# Patient Record
Sex: Female | Born: 2000 | Race: White | Hispanic: No | Marital: Single | Smoking: Never smoker
Health system: Southern US, Community
[De-identification: ages and names within clinical notes are randomized; demographics above are authoritative.]

## PROBLEM LIST (undated history)

## (undated) DIAGNOSIS — E282 Polycystic ovarian syndrome: Secondary | ICD-10-CM

## (undated) DIAGNOSIS — J069 Acute upper respiratory infection, unspecified: Secondary | ICD-10-CM

## (undated) DIAGNOSIS — M25562 Pain in left knee: Secondary | ICD-10-CM

## (undated) DIAGNOSIS — M25362 Other instability, left knee: Secondary | ICD-10-CM

## (undated) DIAGNOSIS — N390 Urinary tract infection, site not specified: Secondary | ICD-10-CM

## (undated) DIAGNOSIS — D649 Anemia, unspecified: Secondary | ICD-10-CM

## (undated) DIAGNOSIS — O1490 Unspecified pre-eclampsia, unspecified trimester: Secondary | ICD-10-CM

## (undated) HISTORY — DX: Acute upper respiratory infection, unspecified: J06.9

## (undated) HISTORY — PX: OTHER SURGICAL HISTORY: SHX169

## (undated) HISTORY — DX: Other instability, left knee: M25.362

## (undated) HISTORY — DX: Other instability, left knee: M25.562

## (undated) HISTORY — PX: TONSILLECTOMY: SUR1361

---
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
## 2022-03-08 ENCOUNTER — Encounter: Payer: Self-pay | Admitting: Women's Health

## 2022-03-12 DIAGNOSIS — Z3A01 Less than 8 weeks gestation of pregnancy: Secondary | ICD-10-CM | POA: Diagnosis not present

## 2022-03-12 DIAGNOSIS — O209 Hemorrhage in early pregnancy, unspecified: Secondary | ICD-10-CM | POA: Diagnosis not present

## 2022-03-12 DIAGNOSIS — N760 Acute vaginitis: Secondary | ICD-10-CM | POA: Diagnosis not present

## 2022-03-25 ENCOUNTER — Other Ambulatory Visit: Payer: Self-pay | Admitting: Obstetrics & Gynecology

## 2022-03-25 DIAGNOSIS — O3680X0 Pregnancy with inconclusive fetal viability, not applicable or unspecified: Secondary | ICD-10-CM

## 2022-03-28 ENCOUNTER — Encounter: Payer: Self-pay | Admitting: *Deleted

## 2022-03-28 ENCOUNTER — Ambulatory Visit (INDEPENDENT_AMBULATORY_CARE_PROVIDER_SITE_OTHER): Payer: Medicaid Other

## 2022-03-28 ENCOUNTER — Ambulatory Visit: Payer: Medicaid Other | Admitting: *Deleted

## 2022-03-28 VITALS — BP 120/87 | HR 83 | Wt 164.0 lb

## 2022-03-28 DIAGNOSIS — Z3400 Encounter for supervision of normal first pregnancy, unspecified trimester: Secondary | ICD-10-CM | POA: Insufficient documentation

## 2022-03-28 DIAGNOSIS — O3680X0 Pregnancy with inconclusive fetal viability, not applicable or unspecified: Secondary | ICD-10-CM

## 2022-03-28 DIAGNOSIS — Z3A08 8 weeks gestation of pregnancy: Secondary | ICD-10-CM | POA: Diagnosis not present

## 2022-03-28 DIAGNOSIS — Z3401 Encounter for supervision of normal first pregnancy, first trimester: Secondary | ICD-10-CM

## 2022-03-28 MED ORDER — BLOOD PRESSURE MONITOR MISC: 0 refills | Status: DC

## 2022-03-28 NOTE — Progress Notes (Signed)
[place] 8+3 wks,CRL 17.55 mm,normal ovaries,FHR 171 bpm

## 2022-03-28 NOTE — Progress Notes (Cosign Needed)
   INITIAL OB NURSE INTAKE  SUBJECTIVE:  Paula Roberts is a 21 y.o. G23P0 female 43w3dby LMP c/w today's U/S with an Estimated Date of Delivery: 11/04/22 being seen today for her initial OB intake/educational visit with RN. She is taking prenatal vitamins.  She is having nausea and/or vomiting and does request nausea meds at this time.  Patient's last menstrual period was 01/28/2022.  Patient's medical, surgical, and obstetrical history obtained and reviewed.  Current medications reviewed.   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   Hirsutism 07/24/2017   Dysmenorrhea in adolescent 08/01/2016   Poor sleep hygiene 12/25/2015   Esophageal reflux 12/25/2015   Keratosis pilaris 12/25/2015   Acne 12/16/2014   Obesity due to excess calories without serious comorbidity with body mass index (BMI) in 95th to 98th percentile for age in pediatric patient 12/16/2014   Hyponatremia 08/12/2012   Pyelonephritis, acute 08/12/2012   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    PCO (polycystic ovaries)    Recurrent upper respiratory infection (URI)    Urinary tract infection    Past Surgical History:  Procedure Laterality Date   BREAST REDUCTION SURGERY Bilateral 04/24/2020   Procedure: MAMMARY REDUCTION  (BREAST);  Surgeon: TIrene Limbo MD;  Location[facility]  Service: Plastics;  Laterality: Bilateral;   KNEE ARTHROSCOPY WITH MEDIAL PATELLAR FEMORAL LIGAMENT RECONSTRUCTION Left 09/25/2019   Procedure: KNEE ARTHROSCOPY WITH MEDIAL PATELLAR FEMORAL LIGAMENT RECONSTRUCTION EXTRA ARTICULAR;  Surgeon: VHiram Gash MD;  Location[facility]  Service: Orthopedics;  Laterality: Left;   none     TONSILLECTOMY     OB  History     Gravida  1   Para      Term      Preterm      AB      Living         SAB      IAB      Ectopic      Multiple      Live Births              OBJECTIVE:  LMP 01/28/2022   ASSESSMENT/PLAN: G1P0 at 874w3dith an Estimated Date of Delivery: 11/04/22  Prenatal vitamins: continue   Nausea medicines: REQUESTS RX   OB packet given: Yes BabyScripts and MyChart activated  Blood Pressure Cuff: Rx sent today. Discussed to be used for virtual visits and home BP checks.  Genetic & carrier screening discussed: requests Panorama, NT/IT, and Horizon  Placed OB Box on problem list and updated Reviewed recommended weight gain based on pre-gravid BMI BMI >=30, gain max 11-20lb  Follow-up in 4 weeks for NT U/S & New OB visit with provider  Face-to-face time at least 30 minutes. 50% or more of this visit was spent in counseling and coordination of care.  LaKristeen Missresenzo RN-C 03/28/2022 4:53 PM  Chart and initial new ob nurse intake visit note reviewed and agree with plan of care. Rx bonjesta.  KiHinckleyWHHarlingen Surgical [facility] LLC1/21/2023 12:31 PM

## 2022-03-29 MED ORDER — BONJESTA 20-20 MG PO TBCR: 1.0000 | EXTENDED_RELEASE_TABLET | Freq: Every day | ORAL | 8 refills | Status: DC

## 2022-03-29 NOTE — Addendum Note (Signed)
Addended by: Roma Schanz on: 03/29/2022 12:31 PM   Modules accepted: Orders

## 2022-04-04 ENCOUNTER — Telehealth: Payer: Self-pay | Admitting: Women's Health

## 2022-04-04 NOTE — Telephone Encounter (Signed)
Called patient back, no answer. Unable to leave message because mailbox is full. We need the prior authorization from the pharmacy in order to submit it. Checked the fax box and it is not in there.

## 2022-04-04 NOTE — Telephone Encounter (Signed)
Patient called stating that [employer] told her that they were waiting on [place] to complete the prior autho for the patients nausea medicine. So the patient called her insurance and the insurance called stating that they need prior autho for [employer]

## 2022-04-22 ENCOUNTER — Other Ambulatory Visit: Payer: Self-pay | Admitting: Obstetrics & Gynecology

## 2022-04-22 DIAGNOSIS — Z3682 Encounter for antenatal screening for nuchal translucency: Secondary | ICD-10-CM

## 2022-04-25 ENCOUNTER — Encounter: Payer: Self-pay | Admitting: Women's Health

## 2022-04-25 ENCOUNTER — Encounter: Payer: Self-pay | Admitting: *Deleted

## 2022-04-25 ENCOUNTER — Ambulatory Visit (INDEPENDENT_AMBULATORY_CARE_PROVIDER_SITE_OTHER): Payer: Medicaid Other

## 2022-04-25 ENCOUNTER — Ambulatory Visit (INDEPENDENT_AMBULATORY_CARE_PROVIDER_SITE_OTHER): Payer: Self-pay | Admitting: Women's Health

## 2022-04-25 VITALS — BP 123/85 | HR 89 | Wt 160.4 lb

## 2022-04-25 DIAGNOSIS — Z131 Encounter for screening for diabetes mellitus: Secondary | ICD-10-CM | POA: Diagnosis not present

## 2022-04-25 DIAGNOSIS — Z3401 Encounter for supervision of normal first pregnancy, first trimester: Secondary | ICD-10-CM

## 2022-04-25 DIAGNOSIS — Z3402 Encounter for supervision of normal first pregnancy, second trimester: Secondary | ICD-10-CM

## 2022-04-25 DIAGNOSIS — Z3682 Encounter for antenatal screening for nuchal translucency: Secondary | ICD-10-CM | POA: Diagnosis not present

## 2022-04-25 DIAGNOSIS — Z3A12 12 weeks gestation of pregnancy: Secondary | ICD-10-CM

## 2022-04-25 MED ORDER — ASPIRIN 81 MG PO TBEC: 81.0000 mg | DELAYED_RELEASE_TABLET | Freq: Every day | ORAL | 3 refills | Status: DC

## 2022-04-25 NOTE — Patient Instructions (Signed)
Paula Roberts, thank you for choosing our office today! We appreciate the opportunity to meet your healthcare needs. You may receive a short survey by mail, e-mail, or through [employer]. If you are happy with your care we would appreciate if you could take just a few minutes to complete the survey questions. We read all of your comments and take your feedback very seriously. Thank you again for choosing our office.  [facility] [employer] [facility] Team at [place] at [facility] ([place], [place] [address]) Entrance C, located off of [address] parking   Nausea & Vomiting Have saltine crackers or pretzels by your bed and eat a few bites before you raise your head out of bed in the morning Eat small frequent meals throughout the day instead of large meals Drink plenty of fluids throughout the day to stay hydrated, just don't drink a lot of fluids with your meals.  This can make your stomach fill up faster making you feel sick Do not brush your teeth right after you eat Products with real ginger are good for nausea, like ginger ale and ginger hard candy Make sure it says made with real ginger! Sucking on sour candy like lemon heads is also good for nausea If your prenatal vitamins make you nauseated, take them at night so you will sleep through the nausea Sea Bands If you feel like you need medicine for the nausea & vomiting please let us know If you are unable to keep any fluids or food down please let us know   Constipation Drink plenty of fluid, preferably water, throughout the day Eat foods high in fiber such as fruits, vegetables, and grains Exercise, such as walking, is a good way to keep your bowels regular Drink warm fluids, especially warm prune juice, or decaf coffee Eat a 1/2 cup of real oatmeal (not instant), 1/2 cup applesauce, and 1/2-1 cup warm prune juice every day If needed, you may take Colace (docusate sodium) stool softener  once or twice a day to help keep the stool soft.  If you still are having problems with constipation, you may take Miralax once daily as needed to help keep your bowels regular.   Home Blood Pressure Monitoring for Patients   Your provider has recommended that you check your blood pressure (BP) at least once a week at home. If you do not have a blood pressure cuff at home, one will be provided for you. Contact your provider if you have not received your monitor within 1 week.   Helpful Tips for Accurate Home Blood Pressure Checks  Don't smoke, exercise, or drink caffeine 30 minutes before checking your BP Use the restroom before checking your BP (a full bladder can raise your pressure) Relax in a comfortable upright chair Feet on the ground Left arm resting comfortably on a flat surface at the level of your heart Legs uncrossed Back supported Sit quietly and don't talk Place the cuff on your bare arm Adjust snuggly, so that only two fingertips can fit between your skin and the top of the cuff Check 2 readings separated by at least one minute Keep a log of your BP readings For a visual, please reference this diagram: http://ccnc.care/bpdiagram  Provider Name: [facility]     Phone: [phone]  Zone 1: ALL CLEAR  Continue to monitor your symptoms:  BP reading is less than 140 (top number) or less than 90 (bottom  number)  No right upper stomach pain No headaches or seeing spots No feeling nauseated or throwing up No swelling in face and hands  Zone 2: CAUTION Call your doctor's office for any of the following:  BP reading is greater than 140 (top number) or greater than 90 (bottom number)  Stomach pain under your ribs in the middle or right side Headaches or seeing spots Feeling nauseated or throwing up Swelling in face and hands  Zone 3: EMERGENCY  Seek immediate medical care if you have any of the following:  BP reading is greater than160 (top number) or greater than  110 (bottom number) Severe headaches not improving with Tylenol Serious difficulty catching your breath Any worsening symptoms from Zone 2    First Trimester of Pregnancy The first trimester of pregnancy is from week 1 until the end of week 12 (months 1 through 3). A week after a sperm fertilizes an egg, the egg will implant on the wall of the uterus. This embryo will begin to develop into a baby. Genes from you and your partner are forming the baby. The female genes determine whether the baby is a boy or a girl. At 6-8 weeks, the eyes and face are formed, and the heartbeat can be seen on ultrasound. At the end of 12 weeks, all the baby's organs are formed.  Now that you are pregnant, you will want to do everything you can to have a healthy baby. Two of the most important things are to get good prenatal care and to follow your health care provider's instructions. Prenatal care is all the medical care you receive before the baby's birth. This care will help prevent, find, and treat any problems during the pregnancy and childbirth. BODY CHANGES Your body goes through many changes during pregnancy. The changes vary from woman to woman.  You may gain or lose a couple of pounds at first. You may feel sick to your stomach (nauseous) and throw up (vomit). If the vomiting is uncontrollable, call your health care provider. You may tire easily. You may develop headaches that can be relieved by medicines approved by your health care provider. You may urinate more often. Painful urination may mean you have a bladder infection. You may develop heartburn as a result of your pregnancy. You may develop constipation because certain hormones are causing the muscles that push waste through your intestines to slow down. You may develop hemorrhoids or swollen, bulging veins (varicose veins). Your breasts may begin to grow larger and become tender. Your nipples may stick out more, and the tissue that surrounds them  (areola) may become darker. Your gums may bleed and may be sensitive to brushing and flossing. Dark spots or blotches (chloasma, mask of pregnancy) may develop on your face. This will likely fade after the baby is born. Your menstrual periods will stop. You may have a loss of appetite. You may develop cravings for certain kinds of food. You may have changes in your emotions from day to day, such as being excited to be pregnant or being concerned that something may go wrong with the pregnancy and baby. You may have more vivid and strange dreams. You may have changes in your hair. These can include thickening of your hair, rapid growth, and changes in texture. Some women also have hair loss during or after pregnancy, or hair that feels dry or thin. Your hair will most likely return to normal after your baby is born. WHAT TO EXPECT AT YOUR PRENATAL  VISITS During a routine prenatal visit: You will be weighed to make sure you and the baby are growing normally. Your blood pressure will be taken. Your abdomen will be measured to track your baby's growth. The fetal heartbeat will be listened to starting around week 10 or 12 of your pregnancy. Test results from any previous visits will be discussed. Your health care provider may ask you: How you are feeling. If you are feeling the baby move. If you have had any abnormal symptoms, such as leaking fluid, bleeding, severe headaches, or abdominal cramping. If you have any questions. Other tests that may be performed during your first trimester include: Blood tests to find your blood type and to check for the presence of any previous infections. They will also be used to check for low iron levels (anemia) and Rh antibodies. Later in the pregnancy, blood tests for diabetes will be done along with other tests if problems develop. Urine tests to check for infections, diabetes, or protein in the urine. An ultrasound to confirm the proper growth and development  of the baby. An amniocentesis to check for possible genetic problems. Fetal screens for spina bifida and Down syndrome. You may need other tests to make sure you and the baby are doing well. HOME CARE INSTRUCTIONS  Medicines Follow your health care provider's instructions regarding medicine use. Specific medicines may be either safe or unsafe to take during pregnancy. Take your prenatal vitamins as directed. If you develop constipation, try taking a stool softener if your health care provider approves. Diet Eat regular, well-balanced meals. Choose a variety of foods, such as meat or vegetable-based protein, fish, milk and low-fat dairy products, vegetables, fruits, and whole grain breads and cereals. Your health care provider will help you determine the amount of weight gain that is right for you. Avoid raw meat and uncooked cheese. These carry germs that can cause birth defects in the baby. Eating four or five small meals rather than three large meals a day may help relieve nausea and vomiting. If you start to feel nauseous, eating a few soda crackers can be helpful. Drinking liquids between meals instead of during meals also seems to help nausea and vomiting. If you develop constipation, eat more high-fiber foods, such as fresh vegetables or fruit and whole grains. Drink enough fluids to keep your urine clear or pale yellow. Activity and Exercise Exercise only as directed by your health care provider. Exercising will help you: Control your weight. Stay in shape. Be prepared for labor and delivery. Experiencing pain or cramping in the lower abdomen or low back is a good sign that you should stop exercising. Check with your health care provider before continuing normal exercises. Try to avoid standing for long periods of time. Move your legs often if you must stand in one place for a long time. Avoid heavy lifting. Wear low-heeled shoes, and practice good posture. You may continue to have sex  unless your health care provider directs you otherwise. Relief of Pain or Discomfort Wear a good support bra for breast tenderness.   Take warm sitz baths to soothe any pain or discomfort caused by hemorrhoids. Use hemorrhoid cream if your health care provider approves.   Rest with your legs elevated if you have leg cramps or low back pain. If you develop varicose veins in your legs, wear support hose. Elevate your feet for 15 minutes, 3-4 times a day. Limit salt in your diet. Prenatal Care Schedule your prenatal visits by the  twelfth week of pregnancy. They are usually scheduled monthly at first, then more often in the last 2 months before delivery. Write down your questions. Take them to your prenatal visits. Keep all your prenatal visits as directed by your health care provider. Safety Wear your seat belt at all times when driving. Make a list of emergency phone numbers, including numbers for family, friends, the hospital, and police and fire departments. General Tips Ask your health care provider for a referral to a local prenatal education class. Begin classes no later than at the beginning of month 6 of your pregnancy. Ask for help if you have counseling or nutritional needs during pregnancy. Your health care provider can offer advice or refer you to specialists for help with various needs. Do not use hot tubs, steam rooms, or saunas. Do not douche or use tampons or scented sanitary pads. Do not cross your legs for long periods of time. Avoid cat litter boxes and soil used by cats. These carry germs that can cause birth defects in the baby and possibly loss of the fetus by miscarriage or stillbirth. Avoid all smoking, herbs, alcohol, and medicines not prescribed by your health care provider. Chemicals in these affect the formation and growth of the baby. Schedule a dentist appointment. At home, brush your teeth with a soft toothbrush and be gentle when you floss. SEEK MEDICAL CARE IF:   You have dizziness. You have mild pelvic cramps, pelvic pressure, or nagging pain in the abdominal area. You have persistent nausea, vomiting, or diarrhea. You have a bad smelling vaginal discharge. You have pain with urination. You notice increased swelling in your face, hands, legs, or ankles. SEEK IMMEDIATE MEDICAL CARE IF:  You have a fever. You are leaking fluid from your vagina. You have spotting or bleeding from your vagina. You have severe abdominal cramping or pain. You have rapid weight gain or loss. You vomit blood or material that looks like coffee grounds. You are exposed to [place] measles and have never had them. You are exposed to fifth disease or chickenpox. You develop a severe headache. You have shortness of breath. You have any kind of trauma, such as from a fall or a car accident. Document Released: 04/19/2001 Document Revised: 09/09/2013 Document Reviewed: 03/05/2013 [facility] Patient Information 2015 [place], [place]. This information is not intended to replace advice given to you by your health care provider. Make sure you discuss any questions you have with your health care provider.

## 2022-04-25 NOTE — Progress Notes (Signed)
INITIAL OBSTETRICAL VISIT Patient name: Paula Roberts MRN [id number]  Date of birth: Jul 22, 2000 Chief Complaint:   Routine Prenatal Visit (New OB)  History of Present Illness:   Paula Roberts is a 21 y.o. G64P0 Caucasian female at 14w3dby LMP c/w u/s at 8 weeks with an Estimated Date of Delivery: 11/04/22 being seen today for her initial obstetrical visit.   Patient's last menstrual period was 01/28/2022. Her obstetrical history is significant for primigravida.   Today she reports  nausea improving- was issue w/ PA and never got meds that were rx'd, declines now .  Pt's mom has h/o IUFD x2 at 313& 37wks, states one was an issue with diabetes. Had 'acid issues' and IOL w/ a subsequent pregnancy, unsure if bile acids/ICP. Does not speak to her mom.  Last pap <21yo. Results were: N/A     04/25/2022    1:52 PM 10/15/2018   11:10 AM  Depression screen PHQ 2/9  Decreased Interest 0 0  Down, Depressed, Hopeless 0 0  PHQ - 2 Score 0 0  Altered sleeping 0 1  Tired, decreased energy 1 1  Change in appetite 1 0  Feeling bad or failure about yourself  0 0  Trouble concentrating 0 0  Moving slowly or fidgety/restless 0 0  Suicidal thoughts 0   PHQ-9 Score 2 2        04/25/2022    1:52 PM  GAD 7 : Generalized Anxiety Score  Nervous, Anxious, on Edge 0  Control/stop worrying 0  Worry too much - different things 0  Trouble relaxing 0  Restless 0  Easily annoyed or irritable 1  Afraid - awful might happen 0  Total GAD 7 Score 1     Review of Systems:   Pertinent items are noted in HPI Denies cramping/contractions, leakage of fluid, vaginal bleeding, abnormal vaginal discharge w/ itching/odor/irritation, headaches, visual changes, shortness of breath, chest pain, abdominal pain, severe nausea/vomiting, or problems with urination or bowel movements unless otherwise stated above.  Pertinent History Reviewed:  Reviewed past medical,surgical, social, obstetrical and family history.   Reviewed problem list, medications and allergies. OB History  Gravida Para Term Preterm AB Living  1            SAB IAB Ectopic Multiple Live Births               # Outcome Date GA Lbr Len/2nd Weight Sex Delivery Anes PTL Lv  1 Current            Physical Assessment:   Vitals:   04/25/22 1411  BP: 123/85  Pulse: 89  Weight: 160 lb 6 oz (72.7 kg)  Body mass index is 31.08 kg/m.       Physical Examination:  General appearance - well appearing, and in no distress  Mental status - alert, oriented to person, place, and time  Psych:  She has a normal mood and affect  Skin - warm and dry, normal color, no suspicious lesions noted  Chest - effort normal, all lung fields clear to auscultation bilaterally  Heart - normal rate and regular rhythm  Abdomen - soft, nontender  Extremities:  No swelling or varicosities noted  Thin prep pap is not done   Chaperone: N/A    TODAY'S NT [place]12+3 wks,measurements c/w dates,CRL 57.81 mm,normal ovaries,NB present,NT 1.6 mm,FHR 164 bpm   No results found for this or any previous visit (from the past 24 hour(s)).  Assessment &  Plan:  1) Low-Risk Pregnancy G1P0 at 72w3dwith an Estimated Date of Delivery: 11/04/22   2) Initial OB visit  3) Family h/o IUFD> mom had 2 IUFDs (34 & 37wks), one d/t 'diabetes issue'  Meds:  Meds ordered this encounter  Medications   aspirin EC 81 MG tablet    Sig: Take 1 tablet (81 mg total) by mouth daily. Swallow whole.    Dispense:  90 tablet    Refill:  3    Initial labs obtained Continue prenatal vitamins Reviewed n/v relief measures and warning s/s to report Reviewed recommended weight gain based on pre-gravid BMI Encouraged well-balanced diet Genetic & carrier screening discussed: requests Panorama, NT/IT, and Horizon  Ultrasound discussed; fetal survey: requested CCNC completed> form faxed if has or is planning to apply for medicaid The nature of [employer] [facility]  multiple MDs and other Advanced Practice Providers was explained to patient; also emphasized that fellows, residents, and students are part of our team. Does have home bp cuff. Rx'd at new ob intake w/ nurse. Check bp weekly, let uKoreaknow if consistently >140/90.   Indications for ASA therapy (per uptodate) OR Two or more of the following: Nulliparity Yes Obesity (BMI>30 kg/m2) Yes  Indications for early A1C (per uptodate) BMI >=25 (>=23 in Asian women) AND one of the following First-degree relative with diabetes Yes (pt's mom) PCOS Yes  Follow-up: Return for 4wks 2nd IT & LROB, 7wks from now anatomy uCathay   Orders Placed This Encounter  Procedures   GC/Chlamydia Probe Amp   Urine Culture   Panorama Prenatal Test Full Panel   HORIZON CUSTOM   Integrated 1   CBC/D/Plt+RPR+Rh+ABO+RubIgG...   Hemoglobin A1c    [place]12/18/2023 2:41 PM

## 2022-04-25 NOTE — Progress Notes (Signed)
[place] 12+3 wks,measurements c/w dates,CRL 57.81 mm,normal ovaries,NB present,NT 1.6 mm,FHR 164 bpm

## 2022-04-27 LAB — INTEGRATED 1
Crown Rump Length: 57.8 mm
Gest. Age on Collection Date: 12.1 weeks
Maternal Age at EDD: 20.3 yr
Nuchal Translucency (NT): 1.6 mm
Number of Fetuses: 1
PAPP-A Value: 1906 ng/mL
Weight: 160 [lb_av]

## 2022-04-27 LAB — CBC/D/PLT+RPR+RH+ABO+RUBIGG...
Antibody Screen: NEGATIVE
Basophils Absolute: 0 10*3/uL (ref 0.0–0.2)
Basos: 0 %
EOS (ABSOLUTE): 0.1 10*3/uL (ref 0.0–0.4)
Eos: 0 %
HCV Ab: NONREACTIVE
HIV Screen 4th Generation wRfx: NONREACTIVE
Hematocrit: 41.5 % (ref 34.0–46.6)
Hemoglobin: 14.2 g/dL (ref 11.1–15.9)
Hepatitis B Surface Ag: NEGATIVE
Immature Grans (Abs): 0 10*3/uL (ref 0.0–0.1)
Immature Granulocytes: 0 %
Lymphocytes Absolute: 2.3 10*3/uL (ref 0.7–3.1)
Lymphs: 20 %
MCH: 32.5 pg (ref 26.6–33.0)
MCHC: 34.2 g/dL (ref 31.5–35.7)
MCV: 95 fL (ref 79–97)
Monocytes Absolute: 0.5 10*3/uL (ref 0.1–0.9)
Monocytes: 5 %
Neutrophils Absolute: 8.5 10*3/uL — ABNORMAL HIGH (ref 1.4–7.0)
Neutrophils: 75 %
Platelets: 324 10*3/uL (ref 150–450)
RBC: 4.37 x10E6/uL (ref 3.77–5.28)
RDW: 12.3 % (ref 11.7–15.4)
RPR Ser Ql: NONREACTIVE
Rh Factor: POSITIVE
Rubella Antibodies, IGG: 1.58 index (ref >0.99)
WBC: 11.4 10*3/uL — ABNORMAL HIGH (ref 3.4–10.8)

## 2022-04-27 LAB — GC/CHLAMYDIA PROBE AMP
Chlamydia trachomatis, NAA: NEGATIVE
Neisseria Gonorrhoeae by PCR: NEGATIVE

## 2022-04-27 LAB — HEMOGLOBIN A1C
Est. average glucose Bld gHb Est-mCnc: 100 mg/dL
Hgb A1c MFr Bld: 5.1 % (ref 4.8–5.6)

## 2022-04-27 LAB — URINE CULTURE

## 2022-04-27 LAB — HCV INTERPRETATION

## 2022-05-09 LAB — PANORAMA PRENATAL TEST FULL PANEL:PANORAMA TEST PLUS 5 ADDITIONAL MICRODELETIONS

## 2022-05-09 LAB — HORIZON CUSTOM: REPORT SUMMARY: NEGATIVE

## 2022-05-09 NOTE — L&D Delivery Note (Signed)
Delivery Note 22 y.o. G1P0 at [redacted]w[redacted]d admitted for IOL due to gHTN and noted to have pre-eclampsia with severe features, on Magnesium.   At 9:42 AM a viable female was delivered via Vaginal, Spontaneous (Presentation: Left Occiput Anterior).  APGAR: 8, 9; weight 5 lb 12.1 oz (2610 g).    Attempted controlled cord traction for 3rd stage of labor, however, the cord avulsed twice, necessitating a manual placenta extraction which was performed easily and with placenta intact.  Noted to have a continuous small trickle of blood. TXA given, fundal massage, and of cytotec rectally. Also had lower uterine segment sweep X 2. Bleeding slowed down significantly.  Placenta status: Manual removal, Intact.  Cord: 3 vessels with the following complications: Short.  Cord pH: not collected  Anesthesia: Epidural Episiotomy: None Lacerations: None Suture Repair:  None Est. Blood Loss (mL): 722  IV ancef 2g given for antibiotic prophylaxis.  Mom to postpartum.  Baby to Couplet care / Skin to Skin.   Sheppard Evens MD MPH OB Fellow, Faculty Practice [facility], [facility] for [facility] 10/15/2022

## 2022-05-15 ENCOUNTER — Encounter: Payer: Self-pay | Admitting: Women's Health

## 2022-05-16 LAB — PANORAMA PRENATAL TEST FULL PANEL:PANORAMA TEST PLUS 5 ADDITIONAL MICRODELETIONS: FETAL FRACTION: 7.9

## 2022-05-23 ENCOUNTER — Ambulatory Visit (INDEPENDENT_AMBULATORY_CARE_PROVIDER_SITE_OTHER): Payer: Medicaid Other | Admitting: Women's Health

## 2022-05-23 ENCOUNTER — Encounter: Payer: Self-pay | Admitting: Women's Health

## 2022-05-23 VITALS — BP 109/76 | HR 77 | Wt 168.5 lb

## 2022-05-23 DIAGNOSIS — Z363 Encounter for antenatal screening for malformations: Secondary | ICD-10-CM

## 2022-05-23 DIAGNOSIS — Z3402 Encounter for supervision of normal first pregnancy, second trimester: Secondary | ICD-10-CM | POA: Diagnosis not present

## 2022-05-23 DIAGNOSIS — Z1379 Encounter for other screening for genetic and chromosomal anomalies: Secondary | ICD-10-CM | POA: Diagnosis not present

## 2022-05-23 NOTE — Progress Notes (Signed)
LOW-RISK PREGNANCY VISIT Patient name: Paula Roberts MRN [id number]  Date of birth: 04-29-2001 Chief Complaint:   Routine Prenatal Visit (2nd IT today)  History of Present Illness:   ABBEGAIL MATUSKA is a 22 y.o. G1P0 female at 94w3dwith an Estimated Date of Delivery: 11/04/22 being seen today for ongoing management of a low-risk pregnancy.   Today she reports no complaints. Contractions: Not present. Vag. Bleeding: None.  Movement: Present. denies leaking of fluid.     04/25/2022    1:52 PM 10/15/2018   11:10 AM  Depression screen PHQ 2/9  Decreased Interest 0 0  Down, Depressed, Hopeless 0 0  PHQ - 2 Score 0 0  Altered sleeping 0 1  Tired, decreased energy 1 1  Change in appetite 1 0  Feeling bad or failure about yourself  0 0  Trouble concentrating 0 0  Moving slowly or fidgety/restless 0 0  Suicidal thoughts 0   PHQ-9 Score 2 2        04/25/2022    1:52 PM  GAD 7 : Generalized Anxiety Score  Nervous, Anxious, on Edge 0  Control/stop worrying 0  Worry too much - different things 0  Trouble relaxing 0  Restless 0  Easily annoyed or irritable 1  Afraid - awful might happen 0  Total GAD 7 Score 1      Review of Systems:   Pertinent items are noted in HPI Denies abnormal vaginal discharge w/ itching/odor/irritation, headaches, visual changes, shortness of breath, chest pain, abdominal pain, severe nausea/vomiting, or problems with urination or bowel movements unless otherwise stated above. Pertinent History Reviewed:  Reviewed past medical,surgical, social, obstetrical and family history.  Reviewed problem list, medications and allergies. Physical Assessment:   Vitals:   05/23/22 1339  BP: 109/76  Pulse: 77  Weight: 168 lb 8 oz (76.4 kg)  Body mass index is 32.65 kg/m.        Physical Examination:   General appearance: Well appearing, and in no distress  Mental status: Alert, oriented to person, place, and time  Skin: Warm & dry  Cardiovascular: Normal  heart rate noted  Respiratory: Normal respiratory effort, no distress  Abdomen: Soft, gravid, nontender  Pelvic: Cervical exam deferred         Extremities:    Fetal Status: Fetal Heart Rate (bpm): 143   Movement: Present    Chaperone: N/A   No results found for this or any previous visit (from the past 24 hour(s)).  Assessment & Plan:  1) Low-risk pregnancy G1P0 at 178w3dith an Estimated Date of Delivery: 11/04/22     Meds: No orders of the defined types were placed in this encounter.  Labs/procedures today: 2nd IT  Plan:  Continue routine obstetrical care  Next visit: prefers will be in person for u/s     Reviewed: Preterm labor symptoms and general obstetric precautions including but not limited to vaginal bleeding, contractions, leaking of fluid and fetal movement were reviewed in detail with the patient.  All questions were answered. Does have home bp cuff. Office bp cuff given: not applicable. Check bp weekly, let usKoreanow if consistently >140 and/or >90.  Follow-up: Return for As scheduled.  Future Appointments  Date Time Provider De[place]2/10/2022  3:00 PM [facility] US[place]WH-FTIMG None  06/14/2022  3:50 PM ErChancy MilroyMD [facility]    Orders Placed This Encounter  Procedures   US[place]oreaB Comp + 14 Wk  INTEGRATED 2   [place], [employer] 05/23/2022 1:55 PM

## 2022-05-23 NOTE — Patient Instructions (Signed)
Kalynn, thank you for choosing our office today! We appreciate the opportunity to meet your healthcare needs. You may receive a short survey by mail, e-mail, or through [employer]. If you are happy with your care we would appreciate if you could take just a few minutes to complete the survey questions. We read all of your comments and take your feedback very seriously. Thank you again for choosing our office.  Center for [employer] Team at [place] at [facility] ([place], [place] [address]) Entrance C, located off of [address] parking  Go to [email or web address] to register for FREE online childbirth classes  Call the office [phone]) or go to [facility] if: You begin to severe cramping Your water breaks.  Sometimes it is a big gush of fluid, sometimes it is just a trickle that keeps getting your panties wet or running down your legs You have vaginal bleeding.  It is normal to have a small amount of spotting if your cervix was checked.   [facility] Pediatricians/Family Doctors [facility]): [address] [place], [facility]: [address] [place], [phone]                [facility]): [place], [phone] (call to ask if accepting patients) [facility] Department: [address], [place], [place] Pediatricians/Family Doctors [facility]): [place]. [place], [address], [place] [facility]: [address] [place], [place] [facility]: [place], [place] [facility]): [phone] [facility]: [address] [place]: [address] [facility]: [phone], [phone]  Home Blood Pressure Monitoring for Patients   Your provider has recommended that you check your blood pressure (BP) at least once a week at home. If you do not have a blood pressure cuff at home, one will be provided for you. Contact your provider if you have not received your monitor within 1 week.   Helpful Tips for Accurate Home Blood Pressure Checks  Don't smoke, exercise, or drink caffeine 30 minutes before checking your BP Use the restroom before checking your BP (a full bladder can raise your pressure) Relax in a comfortable upright chair Feet on the ground Left arm resting comfortably on a flat surface at the level of your heart Legs uncrossed Back supported Sit quietly and don't talk Place the cuff on your bare arm Adjust snuggly, so that only two fingertips can fit between your skin and the top of the cuff Check 2 readings separated by at least one minute Keep a log of your BP readings For a visual, please reference this diagram: http://ccnc.care/bpdiagram  Provider Name: [facility]     Phone: [phone]  Zone 1: ALL CLEAR  Continue to monitor your symptoms:  BP reading is less than 140 (top number) or less than 90 (bottom number)  No right upper stomach pain No headaches or seeing spots No feeling nauseated or throwing up No swelling in face and hands  Zone 2: CAUTION Call your doctor's office for any of the following:  BP reading is greater than 140 (top number) or greater than  90 (bottom number)  Stomach pain under your ribs in the middle or right side Headaches or seeing spots Feeling nauseated or throwing up Swelling in face and hands  Zone 3: EMERGENCY  Seek immediate medical care if you have any of the following:  BP reading is greater than160 (top number) or greater than 110 (bottom number) Severe headaches not improving with Tylenol Serious difficulty catching your breath Any worsening symptoms from  Zone 2     Second Trimester of Pregnancy The second trimester is from week 14 through week 27 (months 4 through 6). The second trimester is often a time when you feel your best. Your body has adjusted to being pregnant, and you begin to feel better physically. Usually, morning sickness has lessened or quit completely, you may have more energy, and you may have an increase in appetite. The second trimester is also a time when the fetus is growing rapidly. At the end of the sixth month, the fetus is about 9 inches long and weighs about 1 pounds. You will likely begin to feel the baby move (quickening) between 16 and 20 weeks of pregnancy. Body changes during your second trimester Your body continues to go through many changes during your second trimester. The changes vary from woman to woman. Your weight will continue to increase. You will notice your lower abdomen bulging out. You may begin to get stretch marks on your hips, abdomen, and breasts. You may develop headaches that can be relieved by medicines. The medicines should be approved by your health care provider. You may urinate more often because the fetus is pressing on your bladder. You may develop or continue to have heartburn as a result of your pregnancy. You may develop constipation because certain hormones are causing the muscles that push waste through your intestines to slow down. You may develop hemorrhoids or swollen, bulging veins (varicose veins). You may have back pain. This is caused by: Weight gain. Pregnancy hormones that are relaxing the joints in your pelvis. A shift in weight and the muscles that support your balance. Your breasts will continue to grow and they will continue to become tender. Your gums may bleed and may be sensitive to brushing and flossing. Dark spots or blotches (chloasma, mask of pregnancy) may develop on your face. This will likely fade after the baby is born. A dark line from your belly button to  the pubic area (linea nigra) may appear. This will likely fade after the baby is born. You may have changes in your hair. These can include thickening of your hair, rapid growth, and changes in texture. Some women also have hair loss during or after pregnancy, or hair that feels dry or thin. Your hair will most likely return to normal after your baby is born.  What to expect at prenatal visits During a routine prenatal visit: You will be weighed to make sure you and the fetus are growing normally. Your blood pressure will be taken. Your abdomen will be measured to track your baby's growth. The fetal heartbeat will be listened to. Any test results from the previous visit will be discussed.  Your health care provider may ask you: How you are feeling. If you are feeling the baby move. If you have had any abnormal symptoms, such as leaking fluid, bleeding, severe headaches, or abdominal cramping. If you are using any tobacco products, including cigarettes, chewing tobacco, and electronic cigarettes. If you have any questions.  Other tests that may be performed during  your second trimester include: Blood tests that check for: Low iron levels (anemia). High blood sugar that affects pregnant women (gestational diabetes) between 41 and 28 weeks. Rh antibodies. This is to check for a protein on red blood cells (Rh factor). Urine tests to check for infections, diabetes, or protein in the urine. An ultrasound to confirm the proper growth and development of the baby. An amniocentesis to check for possible genetic problems. Fetal screens for spina bifida and Down syndrome. HIV (human immunodeficiency virus) testing. Routine prenatal testing includes screening for HIV, unless you choose not to have this test.  Follow these instructions at home: Medicines Follow your health care provider's instructions regarding medicine use. Specific medicines may be either safe or unsafe to take during  pregnancy. Take a prenatal vitamin that contains at least 600 micrograms (mcg) of folic acid. If you develop constipation, try taking a stool softener if your health care provider approves. Eating and drinking Eat a balanced diet that includes fresh fruits and vegetables, whole grains, good sources of protein such as meat, eggs, or tofu, and low-fat dairy. Your health care provider will help you determine the amount of weight gain that is right for you. Avoid raw meat and uncooked cheese. These carry germs that can cause birth defects in the baby. If you have low calcium intake from food, talk to your health care provider about whether you should take a daily calcium supplement. Limit foods that are high in fat and processed sugars, such as fried and sweet foods. To prevent constipation: Drink enough fluid to keep your urine clear or pale yellow. Eat foods that are high in fiber, such as fresh fruits and vegetables, whole grains, and beans. Activity Exercise only as directed by your health care provider. Most women can continue their usual exercise routine during pregnancy. Try to exercise for 30 minutes at least 5 days a week. Stop exercising if you experience uterine contractions. Avoid heavy lifting, wear low heel shoes, and practice good posture. A sexual relationship may be continued unless your health care provider directs you otherwise. Relieving pain and discomfort Wear a good support bra to prevent discomfort from breast tenderness. Take warm sitz baths to soothe any pain or discomfort caused by hemorrhoids. Use hemorrhoid cream if your health care provider approves. Rest with your legs elevated if you have leg cramps or low back pain. If you develop varicose veins, wear support hose. Elevate your feet for 15 minutes, 3-4 times a day. Limit salt in your diet. Prenatal Care Write down your questions. Take them to your prenatal visits. Keep all your prenatal visits as told by your health  care provider. This is important. Safety Wear your seat belt at all times when driving. Make a list of emergency phone numbers, including numbers for family, friends, the hospital, and police and fire departments. General instructions Ask your health care provider for a referral to a local prenatal education class. Begin classes no later than the beginning of month 6 of your pregnancy. Ask for help if you have counseling or nutritional needs during pregnancy. Your health care provider can offer advice or refer you to specialists for help with various needs. Do not use hot tubs, steam rooms, or saunas. Do not douche or use tampons or scented sanitary pads. Do not cross your legs for long periods of time. Avoid cat litter boxes and soil used by cats. These carry germs that can cause birth defects in the baby and possibly loss of the  fetus by miscarriage or stillbirth. Avoid all smoking, herbs, alcohol, and unprescribed drugs. Chemicals in these products can affect the formation and growth of the baby. Do not use any products that contain nicotine or tobacco, such as cigarettes and e-cigarettes. If you need help quitting, ask your health care provider. Visit your dentist if you have not gone yet during your pregnancy. Use a soft toothbrush to brush your teeth and be gentle when you floss. Contact a health care provider if: You have dizziness. You have mild pelvic cramps, pelvic pressure, or nagging pain in the abdominal area. You have persistent nausea, vomiting, or diarrhea. You have a bad smelling vaginal discharge. You have pain when you urinate. Get help right away if: You have a fever. You are leaking fluid from your vagina. You have spotting or bleeding from your vagina. You have severe abdominal cramping or pain. You have rapid weight gain or weight loss. You have shortness of breath with chest pain. You notice sudden or extreme swelling of your face, hands, ankles, feet, or legs. You  have not felt your baby move in over an hour. You have severe headaches that do not go away when you take medicine. You have vision changes. Summary The second trimester is from week 14 through week 27 (months 4 through 6). It is also a time when the fetus is growing rapidly. Your body goes through many changes during pregnancy. The changes vary from woman to woman. Avoid all smoking, herbs, alcohol, and unprescribed drugs. These chemicals affect the formation and growth your baby. Do not use any tobacco products, such as cigarettes, chewing tobacco, and e-cigarettes. If you need help quitting, ask your health care provider. Contact your health care provider if you have any questions. Keep all prenatal visits as told by your health care provider. This is important. This information is not intended to replace advice given to you by your health care provider. Make sure you discuss any questions you have with your health care provider. Document Released: 04/19/2001 Document Revised: 10/01/2015 Document Reviewed: 06/26/2012 Elsevier Interactive Patient Education  2017 [employer].

## 2022-05-25 LAB — INTEGRATED 2
AFP MoM: 1.97
Alpha-Fetoprotein: 58.9 ng/mL
Crown Rump Length: 57.8 mm
DIA MoM: 0.98
DIA Value: 140.3 pg/mL
Estriol, Unconjugated: 1.28 ng/mL
Gest. Age on Collection Date: 12.1 weeks
Gestational Age: 16.1 weeks
Maternal Age at EDD: 20.3 yr
Nuchal Translucency (NT): 1.6 mm
Nuchal Translucency MoM: 1.24
Number of Fetuses: 1
PAPP-A MoM: 2.31
PAPP-A Value: 1906 ng/mL
Test Results:: NEGATIVE
Weight: 160 [lb_av]
Weight: 169 [lb_av]
hCG MoM: 2.3
hCG Value: 78.6 IU/mL
uE3 MoM: 1.36

## 2022-06-14 ENCOUNTER — Ambulatory Visit (INDEPENDENT_AMBULATORY_CARE_PROVIDER_SITE_OTHER): Payer: Medicaid Other | Admitting: Obstetrics and Gynecology

## 2022-06-14 ENCOUNTER — Ambulatory Visit (INDEPENDENT_AMBULATORY_CARE_PROVIDER_SITE_OTHER): Payer: Medicaid Other

## 2022-06-14 ENCOUNTER — Encounter: Payer: Self-pay | Admitting: Obstetrics and Gynecology

## 2022-06-14 VITALS — BP 121/82 | HR 84 | Wt 172.6 lb

## 2022-06-14 DIAGNOSIS — Z3402 Encounter for supervision of normal first pregnancy, second trimester: Secondary | ICD-10-CM

## 2022-06-14 DIAGNOSIS — Z3A19 19 weeks gestation of pregnancy: Secondary | ICD-10-CM

## 2022-06-14 DIAGNOSIS — Z363 Encounter for antenatal screening for malformations: Secondary | ICD-10-CM

## 2022-06-14 NOTE — Progress Notes (Signed)
Subjective:  Paula Roberts is a 22 y.o. G1P0 at 63w4dbeing seen today for ongoing prenatal care.  She is currently monitored for the following issues for this low-risk pregnancy and has Acne; Obesity due to excess calories without serious comorbidity with body mass index (BMI) in 95th to 98th percentile for age in pediatric patient; Poor sleep hygiene; Esophageal reflux; Keratosis pilaris; Hyperandrogenemia; Hirsutism; Seasonal and perennial allergic rhinitis; and Supervision of normal first pregnancy on their problem list.  Patient reports no complaints.  Contractions: Not present. Vag. Bleeding: None.  Movement: Present. Denies leaking of fluid.   The following portions of the patient's history were reviewed and updated as appropriate: allergies, current medications, past family history, past medical history, past social history, past surgical history and problem list. Problem list updated.  Objective:   Vitals:   06/14/22 1558  BP: 121/82  Pulse: 84  Weight: 172 lb 9.6 oz (78.3 kg)    Fetal Status:     Movement: Present     General:  Alert, oriented and cooperative. Patient is in no acute distress.  Skin: Skin is warm and dry. No rash noted.   Cardiovascular: Normal heart rate noted  Respiratory: Normal respiratory effort, no problems with respiration noted  Abdomen: Soft, gravid, appropriate for gestational age. Pain/Pressure: Absent     Pelvic:  Cervical exam deferred        Extremities: Normal range of motion.  Edema: None  Mental Status: Normal mood and affect. Normal behavior. Normal judgment and thought content.   Urinalysis:      Assessment and Plan:  Pregnancy: G1P0 at 167w4d1. Encounter for supervision of normal first pregnancy in second trimester Stable Anatomy scan today  Preterm labor symptoms and general obstetric precautions including but not limited to vaginal bleeding, contractions, leaking of fluid and fetal movement were reviewed in detail with the  patient. Please refer to After Visit Summary for other counseling recommendations.  Return in about 4 weeks (around 07/12/2022) for OB visit, face to face, any provider.   ErChancy MilroyMD

## 2022-06-14 NOTE — Progress Notes (Signed)
[place] 19+4 wks,breech,cx 4.2 cm,anterior placenta gr 0,normal ovaries,SVP of fluid 4 cm,FHR 161 bpm,LVEICF,EFW 285 g 30%,anatomy complete

## 2022-07-07 ENCOUNTER — Encounter: Payer: Self-pay | Admitting: Radiology

## 2022-07-12 ENCOUNTER — Encounter: Payer: Self-pay | Admitting: Women's Health

## 2022-07-12 ENCOUNTER — Ambulatory Visit (INDEPENDENT_AMBULATORY_CARE_PROVIDER_SITE_OTHER): Payer: Medicaid Other | Admitting: Women's Health

## 2022-07-12 VITALS — BP 123/87 | HR 92 | Wt 178.4 lb

## 2022-07-12 DIAGNOSIS — Z3402 Encounter for supervision of normal first pregnancy, second trimester: Secondary | ICD-10-CM

## 2022-07-12 DIAGNOSIS — Z23 Encounter for immunization: Secondary | ICD-10-CM

## 2022-07-12 DIAGNOSIS — Z3A23 23 weeks gestation of pregnancy: Secondary | ICD-10-CM

## 2022-07-12 NOTE — Addendum Note (Signed)
Addended by: Diona Fanti A on: 07/12/2022 03:14 PM   Modules accepted: Orders

## 2022-07-12 NOTE — Progress Notes (Signed)
    LOW-RISK PREGNANCY VISIT Patient name: Paula Roberts MRN [id number]  Date of birth: [birth year] Chief Complaint:   Routine Prenatal Visit (Flu shot today)  History of Present Illness:   Paula Roberts is a 22 y.o. G1P0 female at 68w4dwith an Estimated Date of Delivery: 11/04/22 being seen today for ongoing management of a low-risk pregnancy.   Today she reports no complaints. Contractions: Not present.  .  Movement: Present. denies leaking of fluid.     04/25/2022    1:52 PM 10/15/2018   11:10 AM  Depression screen PHQ 2/9  Decreased Interest 0 0  Down, Depressed, Hopeless 0 0  PHQ - 2 Score 0 0  Altered sleeping 0 1  Tired, decreased energy 1 1  Change in appetite 1 0  Feeling bad or failure about yourself  0 0  Trouble concentrating 0 0  Moving slowly or fidgety/restless 0 0  Suicidal thoughts 0   PHQ-9 Score 2 2        04/25/2022    1:52 PM  GAD 7 : Generalized Anxiety Score  Nervous, Anxious, on Edge 0  Control/stop worrying 0  Worry too much - different things 0  Trouble relaxing 0  Restless 0  Easily annoyed or irritable 1  Afraid - awful might happen 0  Total GAD 7 Score 1      Review of Systems:   Pertinent items are noted in HPI Denies abnormal vaginal discharge w/ itching/odor/irritation, headaches, visual changes, shortness of breath, chest pain, abdominal pain, severe nausea/vomiting, or problems with urination or bowel movements unless otherwise stated above. Pertinent History Reviewed:  Reviewed past medical,surgical, social, obstetrical and family history.  Reviewed problem list, medications and allergies. Physical Assessment:   Vitals:   07/12/22 1428  BP: 123/87  Pulse: 92  Weight: 178 lb 6.4 oz (80.9 kg)  Body mass index is 34.57 kg/m.        Physical Examination:   General appearance: Well appearing, and in no distress  Mental status: Alert, oriented to person, place, and time  Skin: Warm & dry  Cardiovascular: Normal heart rate  noted  Respiratory: Normal respiratory effort, no distress  Abdomen: Soft, gravid, nontender  Pelvic: Cervical exam deferred         Extremities: Edema: None  Fetal Status: Fetal Heart Rate (bpm): 153 Fundal Height: 23 cm Movement: Present    Chaperone: N/A   No results found for this or any previous visit (from the past 24 hour(s)).  Assessment & Plan:  1) Low-risk pregnancy G1P0 at 27w4dith an Estimated Date of Delivery: 11/04/22    Meds: No orders of the defined types were placed in this encounter.  Labs/procedures today: flu shot  Plan:  Continue routine obstetrical care  Next visit: prefers will be in person for pn2     Reviewed: Preterm labor symptoms and general obstetric precautions including but not limited to vaginal bleeding, contractions, leaking of fluid and fetal movement were reviewed in detail with the patient.  All questions were answered. Does have home bp cuff. Office bp cuff given: not applicable. Check bp weekly, let usKoreanow if consistently >140 and/or >90.  Follow-up: Return in about 4 weeks (around 08/09/2022) for LROB, PN2, CNM, in person.  No future appointments.  No orders of the defined types were placed in this encounter.  [facility]WHM[facility]/09/2022 2:49 PM

## 2022-07-12 NOTE — Patient Instructions (Signed)
Pessy, thank you for choosing our office today! We appreciate the opportunity to meet your healthcare needs. You may receive a short survey by mail, e-mail, or through [employer]. If you are happy with your care we would appreciate if you could take just a few minutes to complete the survey questions. We read all of your comments and take your feedback very seriously. Thank you again for choosing our office.  Center for [employer] Team at [place] at [facility] ([place], [place] [address]) Entrance C, located off of [place] parking   You will have your sugar test next visit.  Please do not eat or drink anything after midnight the night before you come, not even water.  You will be here for at least two hours.  Please make an appointment online for the bloodwork at [email or web address] for 8:00am (or as close to this as possible). Make sure you select the [facility] service center.   CLASSES: Go to [email or web address] to register for classes (childbirth, breastfeeding, waterbirth, infant CPR, daddy bootcamp, etc.)  Call the office [phone]) or go to [facility] if: You begin to have strong, frequent contractions Your water breaks.  Sometimes it is a big gush of fluid, sometimes it is just a trickle that keeps getting your panties wet or running down your legs You have vaginal bleeding.  It is normal to have a small amount of spotting if your cervix was checked.  You don't feel your baby moving like normal.  If you don't, get you something to eat and drink and lay down and focus on feeling your baby move.   If your baby is still not moving like normal, you should call the office or go to [facility].  Call the office [phone]) or go to [facility] hospital for these signs of pre-eclampsia: Severe headache that does not go away with Tylenol Visual changes- seeing spots, double, blurred vision Pain under your right breast or upper  abdomen that does not go away with Tums or heartburn medicine Nausea and/or vomiting Severe swelling in your hands, feet, and face    [facility] Pediatricians/Family Doctors [facility]): [address] [place], [place]: [address] [place], [place] [facility]): [place], [phone] (call to ask if accepting patients) [facility] Department: [address] [place], [place] [facility]): [address], [address], [place] [facility]: [address] [place], [place] [facility] [place]: [place], [place] [facility]): [phone] [facility]: [address], [place]: [address] [place] [facility]: [phone], [phone]  Home Blood Pressure Monitoring for Patients   Your provider has recommended that you check your blood pressure (BP) at least once a week at home. If you do not have a blood pressure cuff at home, one will be provided for you. Contact your provider if you have not received your monitor within 1 week.   Helpful Tips for Accurate Home Blood Pressure Checks  Don't smoke, exercise, or drink  caffeine 30 minutes before checking your BP Use the restroom before checking your BP (a full bladder can raise your pressure) Relax in a comfortable upright chair Feet on the ground Left arm resting comfortably on a flat surface at the level of your heart Legs uncrossed Back supported Sit quietly and don't talk Place the cuff on your bare arm Adjust snuggly, so that only two fingertips can fit between your skin and the top of the cuff Check 2  readings separated by at least one minute Keep a log of your BP readings For a visual, please reference this diagram: http://ccnc.care/bpdiagram  Provider Name: [facility]     Phone: [phone]  Zone 1: ALL CLEAR  Continue to monitor your symptoms:  BP reading is less than 140 (top number) or less than 90 (bottom number)  No right upper stomach pain No headaches or seeing spots No feeling nauseated or throwing up No swelling in face and hands  Zone 2: CAUTION Call your doctor's office for any of the following:  BP reading is greater than 140 (top number) or greater than 90 (bottom number)  Stomach pain under your ribs in the middle or right side Headaches or seeing spots Feeling nauseated or throwing up Swelling in face and hands  Zone 3: EMERGENCY  Seek immediate medical care if you have any of the following:  BP reading is greater than160 (top number) or greater than 110 (bottom number) Severe headaches not improving with Tylenol Serious difficulty catching your breath Any worsening symptoms from Zone 2   Second Trimester of Pregnancy The second trimester is from week 13 through week 28, months 4 through 6. The second trimester is often a time when you feel your best. Your body has also adjusted to being pregnant, and you begin to feel better physically. Usually, morning sickness has lessened or quit completely, you may have more energy, and you may have an increase in appetite. The second trimester is also a time when the fetus is growing rapidly. At the end of the sixth month, the fetus is about 9 inches long and weighs about 1 pounds. You will likely begin to feel the baby move (quickening) between 18 and 20 weeks of the pregnancy. BODY CHANGES Your body goes through many changes during pregnancy. The changes vary from woman to woman.  Your weight will continue to increase. You will notice your lower abdomen bulging out. You may begin to get stretch marks on your  hips, abdomen, and breasts. You may develop headaches that can be relieved by medicines approved by your health care provider. You may urinate more often because the fetus is pressing on your bladder. You may develop or continue to have heartburn as a result of your pregnancy. You may develop constipation because certain hormones are causing the muscles that push waste through your intestines to slow down. You may develop hemorrhoids or swollen, bulging veins (varicose veins). You may have back pain because of the weight gain and pregnancy hormones relaxing your joints between the bones in your pelvis and as a result of a shift in weight and the muscles that support your balance. Your breasts will continue to grow and be tender. Your gums may bleed and may be sensitive to brushing and flossing. Dark spots or blotches (chloasma, mask of pregnancy) may develop on your face. This will likely fade after the baby is born. A dark line from your belly button to the pubic area (linea nigra) may appear. This  will likely fade after the baby is born. You may have changes in your hair. These can include thickening of your hair, rapid growth, and changes in texture. Some women also have hair loss during or after pregnancy, or hair that feels dry or thin. Your hair will most likely return to normal after your baby is born. WHAT TO EXPECT AT YOUR PRENATAL VISITS During a routine prenatal visit: You will be weighed to make sure you and the fetus are growing normally. Your blood pressure will be taken. Your abdomen will be measured to track your baby's growth. The fetal heartbeat will be listened to. Any test results from the previous visit will be discussed. Your health care provider may ask you: How you are feeling. If you are feeling the baby move. If you have had any abnormal symptoms, such as leaking fluid, bleeding, severe headaches, or abdominal cramping. If you have any questions. Other tests that may  be performed during your second trimester include: Blood tests that check for: Low iron levels (anemia). Gestational diabetes (between 24 and 28 weeks). Rh antibodies. Urine tests to check for infections, diabetes, or protein in the urine. An ultrasound to confirm the proper growth and development of the baby. An amniocentesis to check for possible genetic problems. Fetal screens for spina bifida and Down syndrome. HOME CARE INSTRUCTIONS  Avoid all smoking, herbs, alcohol, and unprescribed drugs. These chemicals affect the formation and growth of the baby. Follow your health care provider's instructions regarding medicine use. There are medicines that are either safe or unsafe to take during pregnancy. Exercise only as directed by your health care provider. Experiencing uterine cramps is a good sign to stop exercising. Continue to eat regular, healthy meals. Wear a good support bra for breast tenderness. Do not use hot tubs, steam rooms, or saunas. Wear your seat belt at all times when driving. Avoid raw meat, uncooked cheese, cat litter boxes, and soil used by cats. These carry germs that can cause birth defects in the baby. Take your prenatal vitamins. Try taking a stool softener (if your health care provider approves) if you develop constipation. Eat more high-fiber foods, such as fresh vegetables or fruit and whole grains. Drink plenty of fluids to keep your urine clear or pale yellow. Take warm sitz baths to soothe any pain or discomfort caused by hemorrhoids. Use hemorrhoid cream if your health care provider approves. If you develop varicose veins, wear support hose. Elevate your feet for 15 minutes, 3-4 times a day. Limit salt in your diet. Avoid heavy lifting, wear low heel shoes, and practice good posture. Rest with your legs elevated if you have leg cramps or low back pain. Visit your dentist if you have not gone yet during your pregnancy. Use a soft toothbrush to brush your teeth  and be gentle when you floss. A sexual relationship may be continued unless your health care provider directs you otherwise. Continue to go to all your prenatal visits as directed by your health care provider. SEEK MEDICAL CARE IF:  You have dizziness. You have mild pelvic cramps, pelvic pressure, or nagging pain in the abdominal area. You have persistent nausea, vomiting, or diarrhea. You have a bad smelling vaginal discharge. You have pain with urination. SEEK IMMEDIATE MEDICAL CARE IF:  You have a fever. You are leaking fluid from your vagina. You have spotting or bleeding from your vagina. You have severe abdominal cramping or pain. You have rapid weight gain or loss. You have shortness of  breath with chest pain. You notice sudden or extreme swelling of your face, hands, ankles, feet, or legs. You have not felt your baby move in over an hour. You have severe headaches that do not go away with medicine. You have vision changes. Document Released: 04/19/2001 Document Revised: 04/30/2013 Document Reviewed: 06/26/2012 [facility] Patient Information 2015 [place], [place]. This information is not intended to replace advice given to you by your health care provider. Make sure you discuss any questions you have with your health care provider.

## 2022-08-09 ENCOUNTER — Other Ambulatory Visit: Payer: Medicaid Other

## 2022-08-09 ENCOUNTER — Encounter: Payer: Self-pay | Admitting: Women's Health

## 2022-08-09 ENCOUNTER — Ambulatory Visit (INDEPENDENT_AMBULATORY_CARE_PROVIDER_SITE_OTHER): Payer: Medicaid Other | Admitting: Women's Health

## 2022-08-09 VITALS — BP 132/80 | HR 90 | Wt 185.0 lb

## 2022-08-09 DIAGNOSIS — Z3402 Encounter for supervision of normal first pregnancy, second trimester: Secondary | ICD-10-CM

## 2022-08-09 DIAGNOSIS — Z3A27 27 weeks gestation of pregnancy: Secondary | ICD-10-CM

## 2022-08-09 DIAGNOSIS — Z131 Encounter for screening for diabetes mellitus: Secondary | ICD-10-CM | POA: Diagnosis not present

## 2022-08-09 NOTE — Progress Notes (Signed)
LOW-RISK PREGNANCY VISIT Patient name: Paula Roberts MRN [id number]  Date of birth: Sep 15, 2000 Chief Complaint:   Routine Prenatal Visit (PN2/ wait get tdap)  History of Present Illness:   Paula Roberts is a 22 y.o. G1P0 female at [redacted]w[redacted]d with an Estimated Date of Delivery: 11/04/22 being seen today for ongoing management of a low-risk pregnancy.   Today she reports  occ pinch Lt lower back, goes away quickly on its own . Contractions: Not present.  .  Movement: Present. denies leaking of fluid.     04/25/2022    1:52 PM 10/15/2018   11:10 AM  Depression screen PHQ 2/9  Decreased Interest 0 0  Down, Depressed, Hopeless 0 0  PHQ - 2 Score 0 0  Altered sleeping 0 1  Tired, decreased energy 1 1  Change in appetite 1 0  Feeling bad or failure about yourself  0 0  Trouble concentrating 0 0  Moving slowly or fidgety/restless 0 0  Suicidal thoughts 0   PHQ-9 Score 2 2        04/25/2022    1:52 PM  GAD 7 : Generalized Anxiety Score  Nervous, Anxious, on Edge 0  Control/stop worrying 0  Worry too much - different things 0  Trouble relaxing 0  Restless 0  Easily annoyed or irritable 1  Afraid - awful might happen 0  Total GAD 7 Score 1      Review of Systems:   Pertinent items are noted in HPI Denies abnormal vaginal discharge w/ itching/odor/irritation, headaches, visual changes, shortness of breath, chest pain, abdominal pain, severe nausea/vomiting, or problems with urination or bowel movements unless otherwise stated above. Pertinent History Reviewed:  Reviewed past medical,surgical, social, obstetrical and family history.  Reviewed problem list, medications and allergies. Physical Assessment:   Vitals:   08/09/22 0924  BP: 132/80  Pulse: 90  Weight: 185 lb (83.9 kg)  Body mass index is 35.85 kg/m.        Physical Examination:   General appearance: Well appearing, and in no distress  Mental status: Alert, oriented to person, place, and time  Skin: Warm &  dry  Cardiovascular: Normal heart rate noted  Respiratory: Normal respiratory effort, no distress  Abdomen: Soft, gravid, nontender  Pelvic: Cervical exam deferred         Extremities: Edema: None  Fetal Status: Fetal Heart Rate (bpm): 142 Fundal Height: 27 cm Movement: Present    Chaperone: N/A   No results found for this or any previous visit (from the past 24 hour(s)).  Assessment & Plan:  1) Low-risk pregnancy G1P0 at [redacted]w[redacted]d with an Estimated Date of Delivery: 11/04/22     Meds: No orders of the defined types were placed in this encounter.  Labs/procedures today: PN2 and declines tdap today, plans next visit  Plan:  Continue routine obstetrical care  Next visit: prefers in person    Reviewed: Preterm labor symptoms and general obstetric precautions including but not limited to vaginal bleeding, contractions, leaking of fluid and fetal movement were reviewed in detail with the patient.  All questions were answered. Does have home bp cuff. Office bp cuff given: not applicable. Check bp weekly, let us know if consistently >140 and/or >90.  Follow-up: Return in about 3 weeks (around 08/30/2022) for [place], [place], in person.  Future Appointments  Date Time Provider [place]  08/30/2022  2:30 PM Roma Schanz, CNM [facility]    Orders Placed This Encounter  Procedures  Tdap vaccine greater than or equal to 7yo IM   Roma Schanz CNM, [facility] 08/09/2022 9:56 AM

## 2022-08-09 NOTE — Patient Instructions (Signed)
Aneira, thank you for choosing our office today! We appreciate the opportunity to meet your healthcare needs. You may receive a short survey by mail, e-mail, or through [employer]. If you are happy with your care we would appreciate if you could take just a few minutes to complete the survey questions. We read all of your comments and take your feedback very seriously. Thank you again for choosing our office.  Center for [employer] Team at [place] at [facility] ([place], [place] [address]) Entrance C, located off of [address] parking   CLASSES: Go to [email or web address] to register for classes (childbirth, breastfeeding, waterbirth, infant CPR, daddy bootcamp, etc.)  Call the office [phone]) or go to [facility] if: You begin to have strong, frequent contractions Your water breaks.  Sometimes it is a big gush of fluid, sometimes it is just a trickle that keeps getting your panties wet or running down your legs You have vaginal bleeding.  It is normal to have a small amount of spotting if your cervix was checked.  You don't feel your baby moving like normal.  If you don't, get you something to eat and drink and lay down and focus on feeling your baby move.   If your baby is still not moving like normal, you should call the office or go to [facility].  Call the office [phone]) or go to [facility] hospital for these signs of pre-eclampsia: Severe headache that does not go away with Tylenol Visual changes- seeing spots, double, blurred vision Pain under your right breast or upper abdomen that does not go away with Tums or heartburn medicine Nausea and/or vomiting Severe swelling in your hands, feet, and face   Tdap Vaccine It is recommended that you get the Tdap vaccine during the third trimester of EACH pregnancy to help protect your baby from getting pertussis (whooping cough) 27-36 weeks is the BEST time to do  this so that you can pass the protection on to your baby. During pregnancy is better than after pregnancy, but if you are unable to get it during pregnancy it will be offered at the hospital.  You can get this vaccine with [place], at the health department, your family doctor, or some local pharmacies Everyone who will be around your baby should also be up-to-date on their vaccines before the baby comes. Adults (who are not pregnant) only need 1 dose of Tdap during adulthood.   [place] [facility]/Family Doctors [facility]): [address] [place], [place] [facility]: [address] [place], [phone]                [facility]): [place], [phone] (call to ask if accepting patients) [facility] Department: [address], [place], [place] Pediatricians/Family Doctors [facility]): [place] [place], [address], [place] [facility]: [address] [place], [place] [facility]: [place], [place] [facility]): [phone] [facility]: [address] [place]: [address], [place] [facility]: [phone], [phone]  Home Blood Pressure Monitoring for Patients   Your provider has recommended that you check your  blood pressure (BP) at least once a week at home. If you do not have a blood pressure cuff at home, one will be provided for you. Contact your provider if you have not received your monitor within 1 week.   Helpful Tips for Accurate Home Blood Pressure Checks  Don't smoke, exercise, or drink caffeine 30 minutes before checking your BP Use the restroom before checking your BP (a full bladder can raise your  pressure) Relax in a comfortable upright chair Feet on the ground Left arm resting comfortably on a flat surface at the level of your heart Legs uncrossed Back supported Sit quietly and don't talk Place the cuff on your bare arm Adjust snuggly, so that only two fingertips can fit between your skin and the top of the cuff Check 2 readings separated by at least one minute Keep a log of your BP readings For a visual, please reference this diagram: http://ccnc.care/bpdiagram  Provider Name: [facility]     Phone: [phone]  Zone 1: ALL CLEAR  Continue to monitor your symptoms:  BP reading is less than 140 (top number) or less than 90 (bottom number)  No right upper stomach pain No headaches or seeing spots No feeling nauseated or throwing up No swelling in face and hands  Zone 2: CAUTION Call your doctor's office for any of the following:  BP reading is greater than 140 (top number) or greater than 90 (bottom number)  Stomach pain under your ribs in the middle or right side Headaches or seeing spots Feeling nauseated or throwing up Swelling in face and hands  Zone 3: EMERGENCY  Seek immediate medical care if you have any of the following:  BP reading is greater than160 (top number) or greater than 110 (bottom number) Severe headaches not improving with Tylenol Serious difficulty catching your breath Any worsening symptoms from Zone 2   Third Trimester of Pregnancy The third trimester is from week 29 through week 42, months 7 through 9. The third trimester is a time when the fetus is growing rapidly. At the end of the ninth month, the fetus is about 20 inches in length and weighs 6-10 pounds.  BODY CHANGES Your body goes through many changes during pregnancy. The changes vary from woman to woman.  Your weight will continue to increase. You can expect to gain 25-35 pounds (11-16 kg) by the end of the pregnancy. You may begin to get stretch marks on your hips, abdomen,  and breasts. You may urinate more often because the fetus is moving lower into your pelvis and pressing on your bladder. You may develop or continue to have heartburn as a result of your pregnancy. You may develop constipation because certain hormones are causing the muscles that push waste through your intestines to slow down. You may develop hemorrhoids or swollen, bulging veins (varicose veins). You may have pelvic pain because of the weight gain and pregnancy hormones relaxing your joints between the bones in your pelvis. Backaches may result from overexertion of the muscles supporting your posture. You may have changes in your hair. These can include thickening of your hair, rapid growth, and changes in texture. Some women also have hair loss during or after pregnancy, or hair that feels dry or thin. Your hair will most likely return to normal after your baby is born. Your breasts will continue to grow and be tender. A yellow discharge may leak from your breasts called colostrum. Your belly button may stick out. You may   feel short of breath because of your expanding uterus. You may notice the fetus "dropping," or moving lower in your abdomen. You may have a bloody mucus discharge. This usually occurs a few days to a week before labor begins. Your cervix becomes thin and soft (effaced) near your due date. WHAT TO EXPECT AT YOUR PRENATAL EXAMS  You will have prenatal exams every 2 weeks until week 36. Then, you will have weekly prenatal exams. During a routine prenatal visit: You will be weighed to make sure you and the fetus are growing normally. Your blood pressure is taken. Your abdomen will be measured to track your baby's growth. The fetal heartbeat will be listened to. Any test results from the previous visit will be discussed. You may have a cervical check near your due date to see if you have effaced. At around 36 weeks, your caregiver will check your cervix. At the same time, your  caregiver will also perform a test on the secretions of the vaginal tissue. This test is to determine if a type of bacteria, Group B streptococcus, is present. Your caregiver will explain this further. Your caregiver may ask you: What your birth plan is. How you are feeling. If you are feeling the baby move. If you have had any abnormal symptoms, such as leaking fluid, bleeding, severe headaches, or abdominal cramping. If you have any questions. Other tests or screenings that may be performed during your third trimester include: Blood tests that check for low iron levels (anemia). Fetal testing to check the health, activity level, and growth of the fetus. Testing is done if you have certain medical conditions or if there are problems during the pregnancy. FALSE LABOR You may feel small, irregular contractions that eventually go away. These are called Braxton Hicks contractions, or false labor. Contractions may last for hours, days, or even weeks before true labor sets in. If contractions come at regular intervals, intensify, or become painful, it is best to be seen by your caregiver.  SIGNS OF LABOR  Menstrual-like cramps. Contractions that are 5 minutes apart or less. Contractions that start on the top of the uterus and spread down to the lower abdomen and back. A sense of increased pelvic pressure or back pain. A watery or bloody mucus discharge that comes from the vagina. If you have any of these signs before the 37th week of pregnancy, call your caregiver right away. You need to go to the hospital to get checked immediately. HOME CARE INSTRUCTIONS  Avoid all smoking, herbs, alcohol, and unprescribed drugs. These chemicals affect the formation and growth of the baby. Follow your caregiver's instructions regarding medicine use. There are medicines that are either safe or unsafe to take during pregnancy. Exercise only as directed by your caregiver. Experiencing uterine cramps is a good sign to  stop exercising. Continue to eat regular, healthy meals. Wear a good support bra for breast tenderness. Do not use hot tubs, steam rooms, or saunas. Wear your seat belt at all times when driving. Avoid raw meat, uncooked cheese, cat litter boxes, and soil used by cats. These carry germs that can cause birth defects in the baby. Take your prenatal vitamins. Try taking a stool softener (if your caregiver approves) if you develop constipation. Eat more high-fiber foods, such as fresh vegetables or fruit and whole grains. Drink plenty of fluids to keep your urine clear or pale yellow. Take warm sitz baths to soothe any pain or discomfort caused by hemorrhoids. Use hemorrhoid cream if   your caregiver approves. If you develop varicose veins, wear support hose. Elevate your feet for 15 minutes, 3-4 times a day. Limit salt in your diet. Avoid heavy lifting, wear low heal shoes, and practice good posture. Rest a lot with your legs elevated if you have leg cramps or low back pain. Visit your dentist if you have not gone during your pregnancy. Use a soft toothbrush to brush your teeth and be gentle when you floss. A sexual relationship may be continued unless your caregiver directs you otherwise. Do not travel far distances unless it is absolutely necessary and only with the approval of your caregiver. Take prenatal classes to understand, practice, and ask questions about the labor and delivery. Make a trial run to the hospital. Pack your hospital bag. Prepare the baby's nursery. Continue to go to all your prenatal visits as directed by your caregiver. SEEK MEDICAL CARE IF: You are unsure if you are in labor or if your water has broken. You have dizziness. You have mild pelvic cramps, pelvic pressure, or nagging pain in your abdominal area. You have persistent nausea, vomiting, or diarrhea. You have a bad smelling vaginal discharge. You have pain with urination. SEEK IMMEDIATE MEDICAL CARE IF:  You  have a fever. You are leaking fluid from your vagina. You have spotting or bleeding from your vagina. You have severe abdominal cramping or pain. You have rapid weight loss or gain. You have shortness of breath with chest pain. You notice sudden or extreme swelling of your face, hands, ankles, feet, or legs. You have not felt your baby move in over an hour. You have severe headaches that do not go away with medicine. You have vision changes. Document Released: 04/19/2001 Document Revised: 04/30/2013 Document Reviewed: 06/26/2012 ExitCare Patient Information 2015 [employer]. This information is not intended to replace advice given to you by your health care provider. Make sure you discuss any questions you have with your health care provider.       

## 2022-08-10 LAB — HIV ANTIBODY (ROUTINE TESTING W REFLEX): HIV Screen 4th Generation wRfx: NONREACTIVE

## 2022-08-10 LAB — CBC
Hematocrit: 37.5 % (ref 34.0–46.6)
Hemoglobin: 12.6 g/dL (ref 11.1–15.9)
MCH: 31.9 pg (ref 26.6–33.0)
MCHC: 33.6 g/dL (ref 31.5–35.7)
MCV: 95 fL (ref 79–97)
Platelets: 295 10*3/uL (ref 150–450)
RBC: 3.95 x10E6/uL (ref 3.77–5.28)
RDW: 11.9 % (ref 11.7–15.4)
WBC: 16 10*3/uL — ABNORMAL HIGH (ref 3.4–10.8)

## 2022-08-10 LAB — ANTIBODY SCREEN: Antibody Screen: NEGATIVE

## 2022-08-10 LAB — RPR: RPR Ser Ql: NONREACTIVE

## 2022-08-10 LAB — GLUCOSE TOLERANCE, 2 HOURS W/ 1HR
Glucose, 1 hour: 139 mg/dL (ref 70–179)
Glucose, 2 hour: 100 mg/dL (ref 70–152)
Glucose, Fasting: 75 mg/dL (ref 70–91)

## 2022-08-30 ENCOUNTER — Encounter: Payer: Self-pay | Admitting: Women's Health

## 2022-08-30 ENCOUNTER — Ambulatory Visit (INDEPENDENT_AMBULATORY_CARE_PROVIDER_SITE_OTHER): Payer: Medicaid Other | Admitting: Women's Health

## 2022-08-30 VITALS — BP 118/79 | HR 90 | Wt 187.4 lb

## 2022-08-30 DIAGNOSIS — Z23 Encounter for immunization: Secondary | ICD-10-CM | POA: Diagnosis not present

## 2022-08-30 DIAGNOSIS — Z3403 Encounter for supervision of normal first pregnancy, third trimester: Secondary | ICD-10-CM

## 2022-08-30 DIAGNOSIS — Z3A30 30 weeks gestation of pregnancy: Secondary | ICD-10-CM

## 2022-08-30 NOTE — Patient Instructions (Signed)
Paula Roberts, thank you for choosing our office today! We appreciate the opportunity to meet your healthcare needs. You may receive a short survey by mail, e-mail, or through [employer]. If you are happy with your care we would appreciate if you could take just a few minutes to complete the survey questions. We read all of your comments and take your feedback very seriously. Thank you again for choosing our office.  [facility] [employer] Team at [place] at [facility] ([place], [place] [address]) Entrance C, located off of [address] parking   CLASSES: Go to [email or web address] to register for classes (childbirth, breastfeeding, waterbirth, infant CPR, daddy bootcamp, etc.)  Call the office [phone]) or go to [facility] if: You begin to have strong, frequent contractions Your water breaks.  Sometimes it is a big gush of fluid, sometimes it is just a trickle that keeps getting your panties wet or running down your legs You have vaginal bleeding.  It is normal to have a small amount of spotting if your cervix was checked.  You don't feel your baby moving like normal.  If you don't, get you something to eat and drink and lay down and focus on feeling your baby move.   If your baby is still not moving like normal, you should call the office or go to [facility].  Call the office [phone]) or go to [facility] hospital for these signs of pre-eclampsia: Severe headache that does not go away with Tylenol Visual changes- seeing spots, double, blurred vision Pain under your right breast or upper abdomen that does not go away with Tums or heartburn medicine Nausea and/or vomiting Severe swelling in your hands, feet, and face   Tdap Vaccine It is recommended that you get the Tdap vaccine during the third trimester of EACH pregnancy to help protect your baby from getting pertussis (whooping cough) 27-36 weeks is the BEST time to do  this so that you can pass the protection on to your baby. During pregnancy is better than after pregnancy, but if you are unable to get it during pregnancy it will be offered at the hospital.  You can get this vaccine with [place], at the health department, your family doctor, or some local pharmacies Everyone who will be around your baby should also be up-to-date on their vaccines before the baby comes. Adults (who are not pregnant) only need 1 dose of Tdap during adulthood.   [place] [facility]ians/Family Doctors [facility]): [address] [place], [place] [facility]: [address] [place], [phone]                [facility]): [place], [phone] (call to ask if accepting patients) [facility] Department: [address], [place], [place] Pediatricians/Family Doctors [facility]): [place] [place], [place] [facility]: [address] [place], [place] [facility]: [place], [place] [facility]): [phone] [facility]: [address] [place]: [address], [facility]: [phone], [phone]  Home Blood Pressure Monitoring for Patients   Your provider has recommended that you check your  blood pressure (BP) at least once a week at home. If you do not have a blood pressure cuff at home, one will be provided for you. Contact your provider if you have not received your monitor within 1 week.   Helpful Tips for Accurate Home Blood Pressure Checks  Don't smoke, exercise, or drink caffeine 30 minutes before checking your BP Use the restroom before checking your BP (a full bladder can raise your  pressure) Relax in a comfortable upright chair Feet on the ground Left arm resting comfortably on a flat surface at the level of your heart Legs uncrossed Back supported Sit quietly and don't talk Place the cuff on your bare arm Adjust snuggly, so that only two fingertips can fit between your skin and the top of the cuff Check 2 readings separated by at least one minute Keep a log of your BP readings For a visual, please reference this diagram: http://ccnc.care/bpdiagram  Provider Name: [facility]     Phone: [phone]  Zone 1: ALL CLEAR  Continue to monitor your symptoms:  BP reading is less than 140 (top number) or less than 90 (bottom number)  No right upper stomach pain No headaches or seeing spots No feeling nauseated or throwing up No swelling in face and hands  Zone 2: CAUTION Call your doctor's office for any of the following:  BP reading is greater than 140 (top number) or greater than 90 (bottom number)  Stomach pain under your ribs in the middle or right side Headaches or seeing spots Feeling nauseated or throwing up Swelling in face and hands  Zone 3: EMERGENCY  Seek immediate medical care if you have any of the following:  BP reading is greater than160 (top number) or greater than 110 (bottom number) Severe headaches not improving with Tylenol Serious difficulty catching your breath Any worsening symptoms from Zone 2  Preterm Labor and Birth Information  The normal length of a pregnancy is 39-41 weeks. Preterm labor is when labor starts before 37 completed weeks of pregnancy. What are the risk factors for preterm labor? Preterm labor is more likely to occur in women who: Have certain infections during pregnancy such as a bladder infection, sexually transmitted infection, or infection inside the uterus (chorioamnionitis). Have a shorter-than-normal cervix. Have gone into preterm labor before. Have had surgery on their cervix. Are younger than age 17  or older than age 35. Are African American. Are pregnant with twins or multiple babies (multiple gestation). Take street drugs or smoke while pregnant. Do not gain enough weight while pregnant. Became pregnant shortly after having been pregnant. What are the symptoms of preterm labor? Symptoms of preterm labor include: Cramps similar to those that can happen during a menstrual period. The cramps may happen with diarrhea. Pain in the abdomen or lower back. Regular uterine contractions that may feel like tightening of the abdomen. A feeling of increased pressure in the pelvis. Increased watery or bloody mucus discharge from the vagina. Water breaking (ruptured amniotic sac). Why is it important to recognize signs of preterm labor? It is important to recognize signs of preterm labor because babies who are born prematurely may not be fully developed. This can put them at an increased risk for: Long-term (chronic) heart and lung problems. Difficulty immediately after birth with regulating body systems, including blood sugar, body temperature, heart rate, and breathing rate. Bleeding in the brain. Cerebral palsy. Learning difficulties. Death. These risks are highest for babies who are born before 34 weeks   of pregnancy. How is preterm labor treated? Treatment depends on the length of your pregnancy, your condition, and the health of your baby. It may involve: Having a stitch (suture) placed in your cervix to prevent your cervix from opening too early (cerclage). Taking or being given medicines, such as: Hormone medicines. These may be given early in pregnancy to help support the pregnancy. Medicine to stop contractions. Medicines to help mature the baby's lungs. These may be prescribed if the risk of delivery is high. Medicines to prevent your baby from developing cerebral palsy. If the labor happens before 34 weeks of pregnancy, you may need to stay in the hospital. What should I do if I  think I am in preterm labor? If you think that you are going into preterm labor, call your health care provider right away. How can I prevent preterm labor in future pregnancies? To increase your chance of having a full-term pregnancy: Do not use any tobacco products, such as cigarettes, chewing tobacco, and e-cigarettes. If you need help quitting, ask your health care provider. Do not use street drugs or medicines that have not been prescribed to you during your pregnancy. Talk with your health care provider before taking any herbal supplements, even if you have been taking them regularly. Make sure you gain a healthy amount of weight during your pregnancy. Watch for infection. If you think that you might have an infection, get it checked right away. Make sure to tell your health care provider if you have gone into preterm labor before. This information is not intended to replace advice given to you by your health care provider. Make sure you discuss any questions you have with your health care provider. Document Revised: 08/17/2018 Document Reviewed: 09/16/2015 Elsevier Patient Education  2020 [employer]   

## 2022-08-30 NOTE — Progress Notes (Signed)
    LOW-RISK PREGNANCY VISIT Patient name: Paula Roberts MRN [id number]  Date of birth: 2001-05-07 Chief Complaint:   Routine Prenatal Visit  History of Present Illness:   Paula Roberts is a 22 y.o. G1P0 female at [redacted]w[redacted]d with an Estimated Date of Delivery: 11/04/22 being seen today for ongoing management of a low-risk pregnancy.   Today she reports no complaints. Contractions: Not present.  .  Movement: Present. denies leaking of fluid.     04/25/2022    1:52 PM 10/15/2018   11:10 AM  Depression screen PHQ 2/9  Decreased Interest 0 0  Down, Depressed, Hopeless 0 0  PHQ - 2 Score 0 0  Altered sleeping 0 1  Tired, decreased energy 1 1  Change in appetite 1 0  Feeling bad or failure about yourself  0 0  Trouble concentrating 0 0  Moving slowly or fidgety/restless 0 0  Suicidal thoughts 0   PHQ-9 Score 2 2        04/25/2022    1:52 PM  GAD 7 : Generalized Anxiety Score  Nervous, Anxious, on Edge 0  Control/stop worrying 0  Worry too much - different things 0  Trouble relaxing 0  Restless 0  Easily annoyed or irritable 1  Afraid - awful might happen 0  Total GAD 7 Score 1      Review of Systems:   Pertinent items are noted in HPI Denies abnormal vaginal discharge w/ itching/odor/irritation, headaches, visual changes, shortness of breath, chest pain, abdominal pain, severe nausea/vomiting, or problems with urination or bowel movements unless otherwise stated above. Pertinent History Reviewed:  Reviewed past medical,surgical, social, obstetrical and family history.  Reviewed problem list, medications and allergies. Physical Assessment:   Vitals:   08/30/22 1443  BP: 118/79  Pulse: 90  Weight: 187 lb 6.4 oz (85 kg)  Body mass index is 36.31 kg/m.        Physical Examination:   General appearance: Well appearing, and in no distress  Mental status: Alert, oriented to person, place, and time  Skin: Warm & dry  Cardiovascular: Normal heart rate noted  Respiratory:  Normal respiratory effort, no distress  Abdomen: Soft, gravid, nontender  Pelvic: Cervical exam deferred         Extremities: Edema: None  Fetal Status: Fetal Heart Rate (bpm): 145 Fundal Height: 29 cm Movement: Present    Chaperone: N/A   No results found for this or any previous visit (from the past 24 hour(s)).  Assessment & Plan:  1) Low-risk pregnancy G1P0 at [redacted]w[redacted]d with an Estimated Date of Delivery: 11/04/22    Meds: No orders of the defined types were placed in this encounter.  Labs/procedures today: tdap  Plan:  Continue routine obstetrical care  Next visit: prefers in person    Reviewed: Preterm labor symptoms and general obstetric precautions including but not limited to vaginal bleeding, contractions, leaking of fluid and fetal movement were reviewed in detail with the patient.  All questions were answered. Does have home bp cuff. Office bp cuff given: not applicable. Check bp weekly, let us know if consistently >140 and/or >90.  Follow-up: Return in about 2 weeks (around 09/13/2022) for LROB, CNM, in person.  No future appointments.  Orders Placed This Encounter  Procedures   Tdap vaccine greater than or equal to 7yo IM   Cheral Marker CNM, [facility] 08/30/2022 3:14 PM

## 2022-09-13 ENCOUNTER — Encounter: Payer: Self-pay | Admitting: Women's Health

## 2022-09-13 ENCOUNTER — Ambulatory Visit (INDEPENDENT_AMBULATORY_CARE_PROVIDER_SITE_OTHER): Payer: Medicaid Other | Admitting: Women's Health

## 2022-09-13 VITALS — BP 132/94 | HR 88 | Wt 192.0 lb

## 2022-09-13 DIAGNOSIS — Z3A32 32 weeks gestation of pregnancy: Secondary | ICD-10-CM

## 2022-09-13 DIAGNOSIS — Z1389 Encounter for screening for other disorder: Secondary | ICD-10-CM

## 2022-09-13 DIAGNOSIS — Z3403 Encounter for supervision of normal first pregnancy, third trimester: Secondary | ICD-10-CM

## 2022-09-13 DIAGNOSIS — Z3402 Encounter for supervision of normal first pregnancy, second trimester: Secondary | ICD-10-CM

## 2022-09-13 DIAGNOSIS — Z331 Pregnant state, incidental: Secondary | ICD-10-CM

## 2022-09-13 LAB — POCT URINALYSIS DIPSTICK OB
Glucose, UA: NEGATIVE
Ketones, UA: NEGATIVE
Nitrite, UA: NEGATIVE
POC,PROTEIN,UA: NEGATIVE

## 2022-09-13 NOTE — Progress Notes (Signed)
LOW-RISK PREGNANCY VISIT Patient name: Paula Roberts MRN [id number]  Date of birth: [birth year] Chief Complaint:   Routine Prenatal Visit  History of Present Illness:   Paula Roberts is a 22 y.o. G1P0 female at [redacted]w[redacted]d with an Estimated Date of Delivery: 11/04/22 being seen today for ongoing management of a low-risk pregnancy.   Today she reports no complaints. Denies ha, visual changes, ruq/epigastric pain, n/v.  Dog chewed up her bp cuff.  Contractions: Not present.  .  Movement: Present. denies leaking of fluid.     04/25/2022    1:52 PM 10/15/2018   11:10 AM  Depression screen PHQ 2/9  Decreased Interest 0 0  Down, Depressed, Hopeless 0 0  PHQ - 2 Score 0 0  Altered sleeping 0 1  Tired, decreased energy 1 1  Change in appetite 1 0  Feeling bad or failure about yourself  0 0  Trouble concentrating 0 0  Moving slowly or fidgety/restless 0 0  Suicidal thoughts 0   PHQ-9 Score 2 2        04/25/2022    1:52 PM  GAD 7 : Generalized Anxiety Score  Nervous, Anxious, on Edge 0  Control/stop worrying 0  Worry too much - different things 0  Trouble relaxing 0  Restless 0  Easily annoyed or irritable 1  Afraid - awful might happen 0  Total GAD 7 Score 1      Review of Systems:   Pertinent items are noted in HPI Denies abnormal vaginal discharge w/ itching/odor/irritation, headaches, visual changes, shortness of breath, chest pain, abdominal pain, severe nausea/vomiting, or problems with urination or bowel movements unless otherwise stated above. Pertinent History Reviewed:  Reviewed past medical,surgical, social, obstetrical and family history.  Reviewed problem list, medications and allergies. Physical Assessment:   Vitals:   09/13/22 1523 09/13/22 1540  BP: (!) 134/94 (!) 132/94  Pulse: 87 88  Weight: 192 lb (87.1 kg)   Body mass index is 37.2 kg/m.        Physical Examination:   General appearance: Well appearing, and in no distress  Mental status: Alert,  oriented to person, place, and time  Skin: Warm & dry  Cardiovascular: Normal heart rate noted  Respiratory: Normal respiratory effort, no distress  Abdomen: Soft, gravid, nontender  Pelvic: Cervical exam deferred         Extremities: Edema: None  Fetal Status: Fetal Heart Rate (bpm): 148 Fundal Height: 32 cm Movement: Present    Chaperone: N/A   Results for orders placed or performed in visit on 09/13/22 (from the past 24 hour(s))  POC Urinalysis Dipstick OB   Collection Time: 09/13/22  3:54 PM  Result Value Ref Range   Color, UA     Clarity, UA     Glucose, UA Negative Negative   Bilirubin, UA     Ketones, UA negative    Spec Grav, UA     Blood, UA trace    pH, UA     POC,PROTEIN,UA Negative Negative, Trace, Small (1+), Moderate (2+), Large (3+), 4+   Urobilinogen, UA     Nitrite, UA negative    Leukocytes, UA Large (3+) (A) Negative   Appearance     Odor      Assessment & Plan:  1) Low-risk pregnancy G1P0 at [redacted]w[redacted]d with an Estimated Date of Delivery: 11/04/22   2) Elevated bp, asymptomatic, no proteinuria. F/U tomorrow for bp check. Reviewed pre-e s/s, reasons to seek care. Dog chewed  her bp cuff   Meds: No orders of the defined types were placed in this encounter.  Labs/procedures today: none  Plan:  Continue routine obstetrical care  Next visit: prefers in person    Reviewed: Preterm labor symptoms and general obstetric precautions including but not limited to vaginal bleeding, contractions, leaking of fluid and fetal movement were reviewed in detail with the patient.  All questions were answered.  Follow-up: Return in about 1 day (around 09/14/2022) for nurse bp check.  Future Appointments  Date Time Provider Department Center  09/14/2022  9:10 AM [facility] NURSE [facility]    Orders Placed This Encounter  Procedures   POC Urinalysis Dipstick OB   Cheral Marker CNM, [facility] 09/13/2022 3:59 PM

## 2022-09-13 NOTE — Patient Instructions (Addendum)
Samiyah, thank you for choosing our office today! We appreciate the opportunity to meet your healthcare needs. You may receive a short survey by mail, e-mail, or through MyChart. If you are happy with your care we would appreciate if you could take just a few minutes to complete the survey questions. We read all of your comments and take your feedback very seriously. Thank you again for choosing our office.  [facility]  [facility] ([address] [place], [place] [address]) Entrance C, located off of [address] Free 24/7 valet parking   CLASSES: Go to [email or web address] to register for classes (childbirth, breastfeeding, waterbirth, infant CPR, daddy bootcamp, etc.)  Call the office ([phone]) or go to [facility] if: You begin to have strong, frequent contractions Your water breaks.  Sometimes it is a big gush of fluid, sometimes it is just a trickle that keeps getting your panties wet or running down your legs You have vaginal bleeding.  It is normal to have a small amount of spotting if your cervix was checked.  You don't feel your baby moving like normal.  If you don't, get you something to eat and drink and lay down and focus on feeling your baby move.   If your baby is still not moving like normal, you should call the office or go to [facility].  Call the office ([phone]) or go to [facility] for these signs of pre-eclampsia: Severe headache that does not go away with Tylenol Visual changes- seeing spots, double, blurred vision Pain under your right breast or upper abdomen that does not go away with Tums or heartburn medicine Nausea and/or vomiting Severe swelling in your hands, feet, and face   Tdap Vaccine It is recommended that you get the Tdap vaccine during the third trimester of EACH pregnancy to help protect your baby from getting pertussis (whooping cough) 27-36 weeks is the BEST time to do  this so that you can pass the protection on to your baby. During pregnancy is better than after pregnancy, but if you are unable to get it during pregnancy it will be offered at the hospital.  You can get this vaccine with us, at the health department, your family doctor, or some local pharmacies Everyone who will be around your baby should also be up-to-date on their vaccines before the baby comes. Adults (who are not pregnant) only need 1 dose of Tdap during adulthood.   [place] Pediatricians/Family Doctors [facility] ([facility]): [address], [phone]           [facility]: [address], [phone]                [facility] ([facility]): [address], [phone] (call to ask if accepting patients) [facility]: [address], [place], [phone]    [place] Pediatricians/Family Doctors [facility] ([facility]): [address], [phone] [facility]: [address], [phone] [facility]: [address], [phone]  [place] [facility]  [facility] ([facility]): [phone] [facility]: [address], [phone]   [place] Family Doctors [facility]: [address], [phone]  [place] [facility]  [facility]: [address], [phone]  Home Blood Pressure Monitoring for Patients   Your provider has recommended that you check your   blood pressure (BP) at least once a week at home. If you do not have a blood pressure cuff at home, one will be provided for you. Contact your provider if you have not received your monitor within 1 week.   Helpful Tips for Accurate Home Blood Pressure Checks  Don't smoke, exercise, or drink caffeine 30 minutes before checking your BP Use the restroom before checking your BP (a full bladder can raise your  pressure) Relax in a comfortable upright chair Feet on the ground Left arm resting comfortably on a flat surface at the level of your heart Legs uncrossed Back supported Sit quietly and don't talk Place the cuff on your bare arm Adjust snuggly, so that only two fingertips can fit between your skin and the top of the cuff Check 2 readings separated by at least one minute Keep a log of your BP readings For a visual, please reference this diagram: http://ccnc.care/bpdiagram  Provider Name: [facility]     Phone: [phone]  Zone 1: ALL CLEAR  Continue to monitor your symptoms:  BP reading is less than 140 (top number) or less than 90 (bottom number)  No right upper stomach pain No headaches or seeing spots No feeling nauseated or throwing up No swelling in face and hands  Zone 2: CAUTION Call your doctor's office for any of the following:  BP reading is greater than 140 (top number) or greater than 90 (bottom number)  Stomach pain under your ribs in the middle or right side Headaches or seeing spots Feeling nauseated or throwing up Swelling in face and hands  Zone 3: EMERGENCY  Seek immediate medical care if you have any of the following:  BP reading is greater than160 (top number) or greater than 110 (bottom number) Severe headaches not improving with Tylenol Serious difficulty catching your breath Any worsening symptoms from Zone 2  Preterm Labor and Birth Information  The normal length of a pregnancy is 39-41 weeks. Preterm labor is when labor starts before 37 completed weeks of pregnancy. What are the risk factors for preterm labor? Preterm labor is more likely to occur in women who: Have certain infections during pregnancy such as a bladder infection, sexually transmitted infection, or infection inside the uterus (chorioamnionitis). Have a shorter-than-normal cervix. Have gone into preterm labor before. Have had surgery on their cervix. Are younger than age 17  or older than age 35. Are African American. Are pregnant with twins or multiple babies (multiple gestation). Take street drugs or smoke while pregnant. Do not gain enough weight while pregnant. Became pregnant shortly after having been pregnant. What are the symptoms of preterm labor? Symptoms of preterm labor include: Cramps similar to those that can happen during a menstrual period. The cramps may happen with diarrhea. Pain in the abdomen or lower back. Regular uterine contractions that may feel like tightening of the abdomen. A feeling of increased pressure in the pelvis. Increased watery or bloody mucus discharge from the vagina. Water breaking (ruptured amniotic sac). Why is it important to recognize signs of preterm labor? It is important to recognize signs of preterm labor because babies who are born prematurely may not be fully developed. This can put them at an increased risk for: Long-term (chronic) heart and lung problems. Difficulty immediately after birth with regulating body systems, including blood sugar, body temperature, heart rate, and breathing rate. Bleeding in the brain. Cerebral palsy. Learning difficulties. Death. These risks are highest for babies who are born before 34 weeks   of pregnancy. How is preterm labor treated? Treatment depends on the length of your pregnancy, your condition, and the health of your baby. It may involve: Having a stitch (suture) placed in your cervix to prevent your cervix from opening too early (cerclage). Taking or being given medicines, such as: Hormone medicines. These may be given early in pregnancy to help support the pregnancy. Medicine to stop contractions. Medicines to help mature the baby's lungs. These may be prescribed if the risk of delivery is high. Medicines to prevent your baby from developing cerebral palsy. If the labor happens before 34 weeks of pregnancy, you may need to stay in the hospital. What should I do if I  think I am in preterm labor? If you think that you are going into preterm labor, call your health care provider right away. How can I prevent preterm labor in future pregnancies? To increase your chance of having a full-term pregnancy: Do not use any tobacco products, such as cigarettes, chewing tobacco, and e-cigarettes. If you need help quitting, ask your health care provider. Do not use street drugs or medicines that have not been prescribed to you during your pregnancy. Talk with your health care provider before taking any herbal supplements, even if you have been taking them regularly. Make sure you gain a healthy amount of weight during your pregnancy. Watch for infection. If you think that you might have an infection, get it checked right away. Make sure to tell your health care provider if you have gone into preterm labor before. This information is not intended to replace advice given to you by your health care provider. Make sure you discuss any questions you have with your health care provider. Document Revised: 08/17/2018 Document Reviewed: 09/16/2015 Elsevier Patient Education  2020 Elsevier Inc.   

## 2022-09-14 ENCOUNTER — Ambulatory Visit (INDEPENDENT_AMBULATORY_CARE_PROVIDER_SITE_OTHER): Payer: Medicaid Other | Admitting: *Deleted

## 2022-09-14 VITALS — BP 123/83 | HR 86

## 2022-09-14 DIAGNOSIS — Z0130 Encounter for examination of blood pressure without abnormal findings: Secondary | ICD-10-CM

## 2022-09-14 DIAGNOSIS — Z3403 Encounter for supervision of normal first pregnancy, third trimester: Secondary | ICD-10-CM

## 2022-09-14 LAB — POCT URINALYSIS DIPSTICK OB
Blood, UA: NEGATIVE
Glucose, UA: NEGATIVE
Ketones, UA: NEGATIVE
Nitrite, UA: NEGATIVE
POC,PROTEIN,UA: NEGATIVE

## 2022-09-14 NOTE — Progress Notes (Signed)
   NURSE VISIT- BLOOD PRESSURE CHECK  SUBJECTIVE:  Paula Roberts is a 22 y.o. G1P0 female here for BP check. She is [redacted]w[redacted]d pregnant    HYPERTENSION ROS:  Pregnant:  Severe headaches that don't go away with tylenol/other medicines: No  Visual changes (seeing spots/double/blurred vision) No  Severe pain under right breast breast or in center of upper chest No  Severe nausea/vomiting No  Taking medicines as instructed not applicable   OBJECTIVE:  LMP 01/28/2022   Appearance alert, well appearing, and in no distress.  ASSESSMENT: Pregnancy [redacted]w[redacted]d  blood pressure check  PLAN: Discussed with Paula Roberts, CNM   Recommendations: no changes needed   Follow-up: in 2 weeks   Paula Roberts  09/14/2022 9:20 AM

## 2022-09-26 ENCOUNTER — Encounter: Payer: Self-pay | Admitting: Women's Health

## 2022-09-27 ENCOUNTER — Ambulatory Visit (INDEPENDENT_AMBULATORY_CARE_PROVIDER_SITE_OTHER): Payer: Medicaid Other | Admitting: Women's Health

## 2022-09-27 ENCOUNTER — Inpatient Hospital Stay (HOSPITAL_COMMUNITY)
Admission: AD | Admit: 2022-09-27 | Discharge: 2022-09-27 | Disposition: A | Discharge status: Home / Self Care | Payer: Medicaid Other | Attending: Obstetrics and Gynecology | Admitting: Obstetrics and Gynecology

## 2022-09-27 ENCOUNTER — Encounter (HOSPITAL_COMMUNITY): Payer: Self-pay | Admitting: Obstetrics and Gynecology

## 2022-09-27 ENCOUNTER — Encounter: Payer: Self-pay | Admitting: Women's Health

## 2022-09-27 ENCOUNTER — Telehealth: Payer: Self-pay

## 2022-09-27 VITALS — BP 142/100 | HR 90 | Wt 193.0 lb

## 2022-09-27 DIAGNOSIS — Z331 Pregnant state, incidental: Secondary | ICD-10-CM

## 2022-09-27 DIAGNOSIS — Z3403 Encounter for supervision of normal first pregnancy, third trimester: Secondary | ICD-10-CM | POA: Diagnosis not present

## 2022-09-27 DIAGNOSIS — E876 Hypokalemia: Secondary | ICD-10-CM | POA: Diagnosis not present

## 2022-09-27 DIAGNOSIS — Z3A34 34 weeks gestation of pregnancy: Secondary | ICD-10-CM | POA: Insufficient documentation

## 2022-09-27 DIAGNOSIS — O26893 Other specified pregnancy related conditions, third trimester: Secondary | ICD-10-CM | POA: Insufficient documentation

## 2022-09-27 DIAGNOSIS — R030 Elevated blood-pressure reading, without diagnosis of hypertension: Secondary | ICD-10-CM | POA: Diagnosis not present

## 2022-09-27 DIAGNOSIS — O99283 Endocrine, nutritional and metabolic diseases complicating pregnancy, third trimester: Secondary | ICD-10-CM | POA: Insufficient documentation

## 2022-09-27 DIAGNOSIS — O99353 Diseases of the nervous system complicating pregnancy, third trimester: Secondary | ICD-10-CM | POA: Diagnosis not present

## 2022-09-27 DIAGNOSIS — Z1389 Encounter for screening for other disorder: Secondary | ICD-10-CM

## 2022-09-27 DIAGNOSIS — R519 Headache, unspecified: Secondary | ICD-10-CM | POA: Diagnosis present

## 2022-09-27 LAB — CBC WITH DIFFERENTIAL/PLATELET
Abs Immature Granulocytes: 0.1 10*3/uL — ABNORMAL HIGH (ref 0.00–0.07)
Basophils Absolute: 0 10*3/uL (ref 0.0–0.1)
Basophils Relative: 0 %
Eosinophils Absolute: 0.1 10*3/uL (ref 0.0–0.5)
Eosinophils Relative: 0 %
HCT: 36.5 % (ref 36.0–46.0)
Hemoglobin: 12.8 g/dL (ref 12.0–15.0)
Immature Granulocytes: 1 %
Lymphocytes Relative: 16 %
Lymphs Abs: 2.1 10*3/uL (ref 0.7–4.0)
MCH: 32.2 pg (ref 26.0–34.0)
MCHC: 35.1 g/dL (ref 30.0–36.0)
MCV: 91.7 fL (ref 80.0–100.0)
Monocytes Absolute: 0.8 10*3/uL (ref 0.1–1.0)
Monocytes Relative: 6 %
Neutro Abs: 10.7 10*3/uL — ABNORMAL HIGH (ref 1.7–7.7)
Neutrophils Relative %: 77 %
Platelets: 314 10*3/uL (ref 150–400)
RBC: 3.98 MIL/uL (ref 3.87–5.11)
RDW: 12.3 % (ref 11.5–15.5)
WBC: 13.8 10*3/uL — ABNORMAL HIGH (ref 4.0–10.5)
nRBC: 0 % (ref 0.0–0.2)

## 2022-09-27 LAB — COMPREHENSIVE METABOLIC PANEL
ALT: 12 U/L (ref 0–44)
AST: 19 U/L (ref 15–41)
Albumin: 3 g/dL — ABNORMAL LOW (ref 3.5–5.0)
Alkaline Phosphatase: 142 U/L — ABNORMAL HIGH (ref 38–126)
Anion gap: 11 (ref 5–15)
BUN: 5 mg/dL — ABNORMAL LOW (ref 6–20)
CO2: 19 mmol/L — ABNORMAL LOW (ref 22–32)
Calcium: 9.4 mg/dL (ref 8.9–10.3)
Chloride: 107 mmol/L (ref 98–111)
Creatinine, Ser: 0.69 mg/dL (ref 0.44–1.00)
GFR, Estimated: >60 mL/min (ref >60)
Glucose, Bld: 97 mg/dL (ref 70–99)
Potassium: 3.2 mmol/L — ABNORMAL LOW (ref 3.5–5.1)
Sodium: 137 mmol/L (ref 135–145)
Total Bilirubin: 0.6 mg/dL (ref 0.3–1.2)
Total Protein: 6.4 g/dL — ABNORMAL LOW (ref 6.5–8.1)

## 2022-09-27 LAB — POCT URINALYSIS DIPSTICK OB
Glucose, UA: NEGATIVE
Ketones, UA: NEGATIVE
Nitrite, UA: NEGATIVE
POC,PROTEIN,UA: NEGATIVE

## 2022-09-27 LAB — APTT: aPTT: 28 seconds (ref 24–36)

## 2022-09-27 LAB — PROTEIN / CREATININE RATIO, URINE
Creatinine, Urine: 101 mg/dL
Protein Creatinine Ratio: 0.11 mg/mg{Cre} (ref 0.00–0.15)
Total Protein, Urine: 11 mg/dL

## 2022-09-27 LAB — PROTIME-INR
INR: 0.9 (ref 0.8–1.2)
Prothrombin Time: 12.5 seconds (ref 11.4–15.2)

## 2022-09-27 MED ORDER — POTASSIUM CHLORIDE 20 MEQ PO PACK
40.0000 meq | PACK | Freq: Once | ORAL | Status: AC
  Administered 2022-09-27: 40 meq via ORAL
  Filled 2022-09-27: qty 2

## 2022-09-27 MED ORDER — CYCLOBENZAPRINE HCL 10 MG PO TABS: 5.0000 mg | ORAL_TABLET | Freq: Three times a day (TID) | ORAL | 0 refills | Status: AC | PRN

## 2022-09-27 MED ORDER — CYCLOBENZAPRINE HCL 5 MG PO TABS
10.0000 mg | ORAL_TABLET | Freq: Once | ORAL | Status: AC
  Administered 2022-09-27: 10 mg via ORAL
  Filled 2022-09-27: qty 2

## 2022-09-27 MED ORDER — ACETAMINOPHEN 500 MG PO TABS
1000.0000 mg | ORAL_TABLET | Freq: Once | ORAL | Status: AC
  Administered 2022-09-27: 1000 mg via ORAL
  Filled 2022-09-27: qty 2

## 2022-09-27 MED ORDER — ONDANSETRON 4 MG PO TBDP
8.0000 mg | ORAL_TABLET | Freq: Once | ORAL | Status: AC
  Administered 2022-09-27: 8 mg via ORAL
  Filled 2022-09-27: qty 2

## 2022-09-27 NOTE — MAU Note (Signed)
...  Paula Roberts is a 22 y.o. at [redacted]w[redacted]d here in MAU reporting: Sent over from the office for BP eval. She reports her blood pressure was elevated at home so they called her in and it was elevated there. She reports she has had a HA since yesterday. Last took Tylenol yesterday with no relief of HA. Has not eaten anything today. She reports she has been nauseous. Denies VB or LOF. +FM. Denies RUQ/epigastric pain and edema.  Onset of complaint: Yesterday Pain score: 6/10 HA  FHT: 163 initial external Lab orders placed from triage:  UA

## 2022-09-27 NOTE — Progress Notes (Signed)
Work-in LOW-RISK PREGNANCY VISIT Patient name: Paula Roberts MRN [id number]  Date of birth: [birth year] Chief Complaint:   Routine Prenatal Visit (Patient brought BP cuff reading office 152/102 pulse 93. Headache since yesterday)  History of Present Illness:   Paula Roberts is a 22 y.o. G1P0 female at [redacted]w[redacted]d with an Estimated Date of Delivery: 11/04/22 being seen today for ongoing management of a low-risk pregnancy.   Today she is being seen as a work-in for report of home bp's 140s-150s/90s-110s, highest SBP 151, highest DBP 113 and bad headache since yesterday. Took 2 extra strength apap at 1800 and didn't touch it, rates 6-7/10 currently. Vomited once randomly on "Sunday and once today- 'hit out of nowhere'. No n/v now. Denies visual changes, ruq/epigastric pain.   Had elevated bp 5/7 @ 32.4wks of 134/94, returned next day and was 123/83. Contractions: Not present.  .  Movement: Present. denies leaking of fluid.     04/25/2022    1:52 PM 10/15/2018   11:10 AM  Depression screen PHQ 2/9  Decreased Interest 0 0  Down, Depressed, Hopeless 0 0  PHQ - 2 Score 0 0  Altered sleeping 0 1  Tired, decreased energy 1 1  Change in appetite 1 0  Feeling bad or failure about yourself  0 0  Trouble concentrating 0 0  Moving slowly or fidgety/restless 0 0  Suicidal thoughts 0   PHQ-9 Score 2 2        12" /18/2023    1:52 PM  GAD 7 : Generalized Anxiety Score  Nervous, Anxious, on Edge 0  Control/stop worrying 0  Worry too much - different things 0  Trouble relaxing 0  Restless 0  Easily annoyed or irritable 1  Afraid - awful might happen 0  Total GAD 7 Score 1      Review of Systems:   Pertinent items are noted in HPI Denies abnormal vaginal discharge w/ itching/odor/irritation, headaches, visual changes, shortness of breath, chest pain, abdominal pain, severe nausea/vomiting, or problems with urination or bowel movements unless otherwise stated above. Pertinent History Reviewed:   Reviewed past medical,surgical, social, obstetrical and family history.  Reviewed problem list, medications and allergies. Physical Assessment:   Vitals:   09/27/22 1128 09/27/22 1158  BP: (!) 141/98 (!) 142/100  Pulse: (!) 101 90  Weight: 193 lb (87.5 kg)   Body mass index is 37.4 kg/m.        Physical Examination:   General appearance: Well appearing, and in no distress  Mental status: Alert, oriented to person, place, and time  Skin: Warm & dry  Cardiovascular: Normal heart rate noted  Respiratory: Normal respiratory effort, no distress  Abdomen: Soft, gravid, nontender  Pelvic: Cervical exam deferred         Extremities: Edema: None  Fetal Status:     Movement: Present  NST: FHR baseline 135 bpm, Variability: moderate, Accelerations:present, Decelerations:  Absent= Cat 1/reactive Toco: UI   Chaperone: N/A   Results for orders placed or performed in visit on 09/27/22 (from the past 24 hour(s))  POC Urinalysis Dipstick OB   Collection Time: 09/27/22 12:06 PM  Result Value Ref Range   Color, UA     Clarity, UA     Glucose, UA Negative Negative   Bilirubin, UA     Ketones, UA neg    Spec Grav, UA     Blood, UA small    pH, UA     POC,PROTEIN,UA Negative Negative, Trace, Small (  1+), Moderate (2+), Large (3+), 4+   Urobilinogen, UA     Nitrite, UA neg    Leukocytes, UA Large (3+) (A) Negative   Appearance     Odor      Assessment & Plan:  1) Low-risk pregnancy G1P0 at [redacted]w[redacted]d with an Estimated Date of Delivery: 11/04/22   2) Elevated bp w/ concern for severe pre-e, some severe range bp's at home, persistent headache not relieved by 2 extra strength apap. To MAU for further eval, notified Reita Cliche, SNM and [place], [place].    Meds: No orders of the defined types were placed in this encounter.  Labs/procedures today: NST  Follow-up: Return for going to MAU; if d/c'd will need EFW/bpp/dopp and HROB on Thurs or Frid of this week.  No future appointments.  Orders Placed  This Encounter  Procedures   POC Urinalysis Dipstick OB   Cheral Marker CNM, [facility] 09/27/2022 12:11 PM

## 2022-09-27 NOTE — Telephone Encounter (Signed)
Patient called and stated that her blood pressure is high and wants to speak with a nurse.

## 2022-09-27 NOTE — Telephone Encounter (Signed)
Returned patient's call.  States her blood pressure was elevated last night and this morning. 140-150 over 90-100. Developed a headaches yesterday that did not resolve with Tylenol and still has a headache today.  Denies vision changes, epigastric pain.  Has appt tomorrow. Advised patient we will move appt to today for her to be evaluated due to elevated blood pressures.  Pt agreeable to come today.

## 2022-09-27 NOTE — MAU Provider Note (Signed)
MAU Provider Note  History  [id number]  Arrival date and time: 09/27/22 1314   Chief Complaint  Patient presents with   Headache    HPI Paula Roberts is a 22 y.o. G1P0 at [redacted]w[redacted]d by LMP with PMHx notable for esophageal reflux, who presents for rule out preeclampsia.  [facility] and was noted for her blood pressure being as high as 151/113 and having a headache since yesterday.  Her headache did not go away with extra strength Tylenol.  Patient also notes 1 episode of nausea/vomiting.  [facility] for rule out of Pre-E.   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    PCO (polycystic ovaries)    Recurrent upper respiratory infection (URI)    Urinary tract infection     Past Surgical History:  Procedure Laterality Date   BREAST REDUCTION SURGERY Bilateral 04/24/2020   Procedure: MAMMARY REDUCTION  (BREAST);  Surgeon: Glenna Fellows, MD;  Location: [facility];  Service: Plastics;  Laterality: Bilateral;   KNEE ARTHROSCOPY WITH MEDIAL PATELLAR FEMORAL LIGAMENT RECONSTRUCTION Left 09/25/2019   Procedure: KNEE ARTHROSCOPY WITH MEDIAL PATELLAR FEMORAL LIGAMENT RECONSTRUCTION EXTRA ARTICULAR;  Surgeon: Bjorn Pippin, MD;  Location: [facility];  Service: Orthopedics;  Laterality: Left;   none     TONSILLECTOMY      Family History  Adopted: Yes  Problem Relation Age of Onset   Diabetes Mother    Multiple sclerosis Mother    Kidney disease Maternal Grandmother    Scleroderma Maternal Grandmother    Diabetes Maternal Grandfather     Social  History   Socioeconomic History   Marital status: Single    Spouse name: Not on file   Number of children: Not on file   Years of education: Not on file   Highest education level: Not on file  Occupational History   Not on file  Tobacco Use   Smoking status: Never   Smokeless tobacco: Never  Vaping Use   Vaping Use: Former  Substance and Sexual Activity   Alcohol use: Never   Drug use: Never   Sexual activity: Yes    Birth control/protection: None  Other Topics Concern   Not on file  Social History Narrative   Lives with maternal grandparents (have officially completed work adopting her).   Graduated from [employer]          She would like to go to [employer] or [employer] for Criminal Psychology. She is working on applying for Environmental consultant.       Social Determinants of Health   Financial Resource Strain: Patient Declined (03/28/2022)   Overall Financial Resource Strain (CARDIA)    Difficulty of Paying Living Expenses: Patient declined  Food Insecurity: No Food Insecurity (03/28/2022)   Hunger Vital Sign    Worried About Running Out of Food in the Last Year:  Never true    Ran Out of Food in the Last Year: Never true  Transportation Needs: No Transportation Needs (03/28/2022)   PRAPARE - Administrator, Civil Service (Medical): No    Lack of Transportation (Non-Medical): No  Physical Activity: Sufficiently Active (03/28/2022)   Exercise Vital Sign    Days of Exercise per Week: 7 days    Minutes of Exercise per Session: 30 min  Stress: No Stress Concern Present (03/28/2022)   [employer] of Occupational Health - Occupational Stress Questionnaire    Feeling of Stress : Not at all  Social Connections: Moderately Isolated (03/28/2022)   Social Connection and Isolation Panel [NHANES]    Frequency of Communication with Friends and Family: More than three times a week    Frequency of Social Gatherings with Friends and Family: Once  a week    Attends Religious Services: Never    Database administrator or Organizations: No    Attends Banker Meetings: Never    Marital Status: Living with partner  Intimate Partner Violence: Not At Risk (03/28/2022)   Humiliation, Afraid, Rape, and Kick questionnaire    Fear of Current or Ex-Partner: No    Emotionally Abused: No    Physically Abused: No    Sexually Abused: No    Allergies  Allergen Reactions   Grass Pollen(K-O-R-T-Swt Vern) Other (See Comments)    Grass, trees & mold. Patient has sinus infections because of these items    No current facility-administered medications on file prior to encounter.   Current Outpatient Medications on File Prior to Encounter  Medication Sig Dispense Refill   aspirin EC 81 MG tablet Take 1 tablet (81 mg total) by mouth daily. Swallow whole. 90 tablet 3   Blood Pressure Monitor MISC For regular home bp monitoring during pregnancy 1 each 0   Prenatal Vit-Fe Fumarate-FA (PRENATAL VITAMIN PO) Take by mouth.     Fexofenadine HCl (ALLEGRA PO) Take by mouth. (Patient not taking: Reported on 09/27/2022)      ROS: Pertinent positives and negative per HPI, all others reviewed and negative  Physical Exam   BP 112/66   Pulse 77   Temp 98.3 F (36.8 C) (Oral)   Resp 19   Ht 5' (1.524 m)   Wt 87.5 kg   LMP 01/28/2022   SpO2 100%   BMI 37.69 kg/m   Patient Vitals for the past 24 hrs:  BP Temp Temp src Pulse Resp SpO2 Height Weight  09/27/22 1701 -- -- -- -- -- 100 % -- --  09/27/22 1656 -- -- -- -- -- 93 % -- --  09/27/22 1651 -- -- -- -- -- 99 % -- --  09/27/22 1646 112/66 -- -- 77 -- 99 % -- --  09/27/22 1641 -- -- -- -- -- 98 % -- --  09/27/22 1636 -- -- -- -- -- 98 % -- --  09/27/22 1631 121/80 -- -- 76 -- 98 % -- --  09/27/22 1626 -- -- -- -- -- 98 % -- --  09/27/22 1621 -- -- -- -- -- 98 % -- --  09/27/22 1616 125/77 -- -- 72 -- 98 % -- --  09/27/22 1611 -- -- -- -- -- 98 % -- --  09/27/22 1606 -- -- -- -- --  98 % -- --  09/27/22 1601 118/79 -- -- 69 -- 98 % -- --  09/27/22 1556 -- -- -- -- -- 99 % -- --  09/27/22  1551 -- -- -- -- -- 99 % -- --  09/27/22 1546 110/67 -- -- 66 -- 98 % -- --  09/27/22 1541 -- -- -- -- -- 99 % -- --  09/27/22 1536 -- -- -- -- -- 99 % -- --  09/27/22 1531 (!) 121/91 -- -- 96 -- 99 % -- --  09/27/22 1516 (!) 112/54 -- -- 72 -- -- -- --  09/27/22 1502 119/61 -- -- 72 -- -- -- --  09/27/22 1501 119/61 -- -- 72 -- -- -- --  09/27/22 1447 116/62 -- -- 69 -- -- -- --  09/27/22 1431 121/77 -- -- 78 -- -- -- --  09/27/22 1343 (!) 138/93 -- -- 93 -- -- -- --  09/27/22 1342 (!) 138/93 98.3 F (36.8 C) Oral 96 19 99 % 5' (1.524 m) 87.5 kg    Physical Exam Vitals reviewed.  Constitutional:      Appearance: Normal appearance.  HENT:     Head: Normocephalic and atraumatic.     Right Ear: External ear normal.     Left Ear: External ear normal.  Cardiovascular:     Rate and Rhythm: Normal rate and regular rhythm.  Pulmonary:     Effort: Pulmonary effort is normal.     Breath sounds: Normal breath sounds.  Abdominal:     General: Abdomen is flat.     Palpations: Abdomen is soft.  Skin:    General: Skin is warm.     Capillary Refill: Capillary refill takes less than 2 seconds.  Psychiatric:        Mood and Affect: Mood normal.     Cervical Exam    Bedside Ultrasound not done  FHT 140 bpm/Moderate variability/ 15x15 accels/ None decels CAT: 1 Toco: none   Labs Results for orders placed or performed during the hospital encounter of 09/27/22 (from the past 24 hour(s))  Protein / creatinine ratio, urine     Status: None   Collection Time: 09/27/22  1:26 PM  Result Value Ref Range   Creatinine, Urine 101 mg/dL   Total Protein, Urine 11 mg/dL   Protein Creatinine Ratio 0.11 0.00 - 0.15 mg/mg[Cre]  CBC with Differential/Platelet     Status: Abnormal   Collection Time: 09/27/22  1:35 PM  Result Value Ref Range   WBC 13.8 (H) 4.0 - 10.5 K/uL   RBC 3.98  3.87 - 5.11 MIL/uL   Hemoglobin 12.8 12.0 - 15.0 g/dL   HCT 40.9 81.1 - 91.4 %   MCV 91.7 80.0 - 100.0 fL   MCH 32.2 26.0 - 34.0 pg   MCHC 35.1 30.0 - 36.0 g/dL   RDW 78.2 95.6 - 21.3 %   Platelets 314 150 - 400 K/uL   nRBC 0.0 0.0 - 0.2 %   Neutrophils Relative % 77 %   Neutro Abs 10.7 (H) 1.7 - 7.7 K/uL   Lymphocytes Relative 16 %   Lymphs Abs 2.1 0.7 - 4.0 K/uL   Monocytes Relative 6 %   Monocytes Absolute 0.8 0.1 - 1.0 K/uL   Eosinophils Relative 0 %   Eosinophils Absolute 0.1 0.0 - 0.5 K/uL   Basophils Relative 0 %   Basophils Absolute 0.0 0.0 - 0.1 K/uL   Immature Granulocytes 1 %   Abs Immature Granulocytes 0.10 (H) 0.00 - 0.07 K/uL  Comprehensive metabolic panel     Status: Abnormal   Collection Time: 09/27/22  1:35 PM  Result Value Ref Range   Sodium 137 135 -  145 mmol/L   Potassium 3.2 (L) 3.5 - 5.1 mmol/L   Chloride 107 98 - 111 mmol/L   CO2 19 (L) 22 - 32 mmol/L   Glucose, Bld 97 70 - 99 mg/dL   BUN 5 (L) 6 - 20 mg/dL   Creatinine, Ser 8.65 0.44 - 1.00 mg/dL   Calcium 9.4 8.9 - 78.4 mg/dL   Total Protein 6.4 (L) 6.5 - 8.1 g/dL   Albumin 3.0 (L) 3.5 - 5.0 g/dL   AST 19 15 - 41 U/L   ALT 12 0 - 44 U/L   Alkaline Phosphatase 142 (H) 38 - 126 U/L   Total Bilirubin 0.6 0.3 - 1.2 mg/dL   GFR, Estimated >69 >62 mL/min   Anion gap 11 5 - 15  Protime-INR     Status: None   Collection Time: 09/27/22  1:35 PM  Result Value Ref Range   Prothrombin Time 12.5 11.4 - 15.2 seconds   INR 0.9 0.8 - 1.2  APTT     Status: None   Collection Time: 09/27/22  1:35 PM  Result Value Ref Range   aPTT 28 24 - 36 seconds    Imaging No results found.  MAU Course  Procedures Lab Orders         CBC with Differential/Platelet         Comprehensive metabolic panel         Protein / creatinine ratio, urine         Protime-INR         APTT    Meds ordered this encounter  Medications   acetaminophen (TYLENOL) tablet 1,000 mg   cyclobenzaprine (FLEXERIL) tablet 10 mg    ondansetron (ZOFRAN-ODT) disintegrating tablet 8 mg   potassium chloride (KLOR-CON) packet 40 mEq   cyclobenzaprine (FLEXERIL) 10 MG tablet    Sig: Take 0.5 tablets (5 mg total) by mouth 3 (three) times daily as needed for up to 14 days for muscle spasms.    Dispense:  21 tablet    Refill:  0   Imaging Orders  No imaging studies ordered today    MDM moderate  Added tylenol 1g and flexeril for HA, Zofran for nausea. Klor Kon given for low potassium of 3.2, CBC nml Coags nml HA resolved  Assessment and Plan  1. Elevated BP without diagnosis of hypertension - Discharge patient  2. Hypokalemia - Discharge patient  3. Intractable headache, unspecified chronicity pattern, unspecified headache type - Discharge patient  4. [redacted] weeks gestation of pregnancy - Discharge patient   Pt seen for preeclampsia rule out.  Pree labs resulted nml. Flexeril and tylenol for home.  Gave return and preterm labor precautions.  Follow-up with primary OB.  Dispo: discharged to home in stable condition.  Discharge Instructions     Discharge patient   Complete by: As directed    Discharge disposition: 01-Home or Self Care   Discharge patient date: 09/27/2022      Allergies as of 09/27/2022       Reactions   Grass Pollen(k-o-r-t-swt Vern) Other (See Comments)   Grass, trees & mold. Patient has sinus infections because of these items        Medication List     TAKE these medications    ALLEGRA PO Take by mouth.   aspirin EC 81 MG tablet Take 1 tablet (81 mg total) by mouth daily. Swallow whole.   Blood Pressure Monitor Misc For regular home bp monitoring during pregnancy   cyclobenzaprine 10  MG tablet Commonly known as: FLEXERIL Take 0.5 tablets (5 mg total) by mouth 3 (three) times daily as needed for up to 14 days for muscle spasms.   PRENATAL VITAMIN PO Take by mouth.        Myrtie Hawk, DO FMOB Fellow, Faculty practice [facility], Center for [facility]  Healthcare 09/27/22  5:12 PM

## 2022-09-28 ENCOUNTER — Encounter: Payer: Medicaid Other | Admitting: Advanced Practice Midwife

## 2022-09-29 ENCOUNTER — Encounter: Payer: Self-pay | Admitting: Women's Health

## 2022-10-11 ENCOUNTER — Other Ambulatory Visit: Payer: Self-pay | Admitting: Women's Health

## 2022-10-11 DIAGNOSIS — O163 Unspecified maternal hypertension, third trimester: Secondary | ICD-10-CM

## 2022-10-12 ENCOUNTER — Other Ambulatory Visit (HOSPITAL_COMMUNITY)
Admission: RE | Admit: 2022-10-12 | Discharge: 2022-10-12 | Disposition: A | Discharge status: Home / Self Care | Payer: Medicaid Other | Source: Ambulatory Visit | Attending: Obstetrics & Gynecology | Admitting: Obstetrics & Gynecology

## 2022-10-12 ENCOUNTER — Ambulatory Visit (INDEPENDENT_AMBULATORY_CARE_PROVIDER_SITE_OTHER): Payer: Medicaid Other | Admitting: Obstetrics & Gynecology

## 2022-10-12 ENCOUNTER — Ambulatory Visit (INDEPENDENT_AMBULATORY_CARE_PROVIDER_SITE_OTHER): Payer: Medicaid Other

## 2022-10-12 ENCOUNTER — Encounter: Payer: Self-pay | Admitting: Obstetrics & Gynecology

## 2022-10-12 VITALS — BP 149/103 | HR 100 | Wt 200.8 lb

## 2022-10-12 DIAGNOSIS — Z3A36 36 weeks gestation of pregnancy: Secondary | ICD-10-CM | POA: Insufficient documentation

## 2022-10-12 DIAGNOSIS — Z3403 Encounter for supervision of normal first pregnancy, third trimester: Secondary | ICD-10-CM

## 2022-10-12 DIAGNOSIS — O163 Unspecified maternal hypertension, third trimester: Secondary | ICD-10-CM

## 2022-10-12 DIAGNOSIS — O139 Gestational [pregnancy-induced] hypertension without significant proteinuria, unspecified trimester: Secondary | ICD-10-CM | POA: Insufficient documentation

## 2022-10-12 DIAGNOSIS — O133 Gestational [pregnancy-induced] hypertension without significant proteinuria, third trimester: Secondary | ICD-10-CM

## 2022-10-12 NOTE — Progress Notes (Signed)
HIGH-RISK PREGNANCY VISIT Patient name: Paula Roberts MRN [id number]  Date of birth: 06/20/2000 Chief Complaint:   Routine Prenatal Visit  History of Present Illness:   Paula Roberts is a 22 y.o. G1P0 female at [redacted]w[redacted]d with an Estimated Date of Delivery: 11/04/22 being seen today for ongoing management of a high-risk pregnancy complicated by:  Gestational HTN  Today she reports no complaints. See in MAU due to headache and BP issues.  Headache has resolved.  Denies change in vision or RUQ pain.  Contractions: Irritability. Vag. Bleeding: None.  Movement: Present. denies leaking of fluid.      04/25/2022    1:52 PM 10/15/2018   11:10 AM  Depression screen PHQ 2/9  Decreased Interest 0 0  Down, Depressed, Hopeless 0 0  PHQ - 2 Score 0 0  Altered sleeping 0 1  Tired, decreased energy 1 1  Change in appetite 1 0  Feeling bad or failure about yourself  0 0  Trouble concentrating 0 0  Moving slowly or fidgety/restless 0 0  Suicidal thoughts 0   PHQ-9 Score 2 2     Current Outpatient Medications  Medication Instructions   aspirin EC 81 mg, Oral, Daily, Swallow whole.   Blood Pressure Monitor MISC For regular home bp monitoring during pregnancy   Fexofenadine HCl (ALLEGRA PO) Take by mouth.   Prenatal Vit-Fe Fumarate-FA (PRENATAL VITAMIN PO) Oral     Review of Systems:   Pertinent items are noted in HPI Denies abnormal vaginal discharge w/ itching/odor/irritation, shortness of breath, chest pain, abdominal pain, severe nausea/vomiting, or problems with urination or bowel movements unless otherwise stated above. Pertinent History Reviewed:  Reviewed past medical,surgical, social, obstetrical and family history.  Reviewed problem list, medications and allergies. Physical Assessment:   Vitals:   10/12/22 1456 10/12/22 1510  BP: (!) 152/103 (!) 149/103  Pulse: 81 100  Weight: 200 lb 12.8 oz (91.1 kg)   Body mass index is 39.22 kg/m.           Physical Examination:    General appearance: alert, well appearing, and in no distress  Mental status: normal mood, behavior, speech, dress, motor activity, and thought processes  Skin: warm & dry   Extremities:      Cardiovascular: normal heart rate noted  Respiratory: normal respiratory effort, no distress  Abdomen: gravid, soft, non-tender  Pelvic: Cervical exam deferred  Dilation: 1.5 Effacement (%): 20 Station: -3 GBS, GC/C obtained  Fetal Status:     Movement: Present    Fetal Surveillance Testing today: cephalic,BPP 8/8,FHR 146 bpm,AFI 18 cm,RI .60,.57,55=54%,EFW 2774 g 31%,anterior placenta gr 3    Chaperone: Faith Rogue  No results found for this or any previous visit (from the past 24 hour(s)).   Assessment & Plan:  High-risk pregnancy: G1P0 at [redacted]w[redacted]d with an Estimated Date of Delivery: 11/04/22   1) Gestational HTN -repeat lab work today -plan for IOL this Friday -reviewed preeclampsia precautions   Meds: No orders of the defined types were placed in this encounter.   Labs/procedures today: GBS, GC/C pending  Treatment Plan:  as outlined above  Reviewed: Preterm labor symptoms and general obstetric precautions including but not limited to vaginal bleeding, contractions, leaking of fluid and fetal movement were reviewed in detail with the patient.  All questions were answered. Pt has home bp cuff. Check bp weekly, let us know if >140/90.   Follow-up: Return for 4-5wk postpartum visit.   Future Appointments  Date Time Provider Department  Center  10/14/2022  6:45 AM [facility] SCHED ROOM [facility] None    Orders Placed This Encounter  Procedures   Culture, beta strep (group b only)   CBC   Comp Met (CMET)   Protein / creatinine ratio, urine    Myna Hidalgo, DO Attending Obstetrician & Gynecologist, Faculty Practice Center for [employer], [facility]

## 2022-10-12 NOTE — Progress Notes (Addendum)
[place] 36+5 wks,cephalic,BPP 8/8,FHR 146 bpm,AFI 18 cm,RI .60,.57,55=54%,EFW 2774 g 31%,anterior placenta gr 3

## 2022-10-13 LAB — COMPREHENSIVE METABOLIC PANEL
ALT: 8 IU/L (ref 0–32)
AST: 15 IU/L (ref 0–40)
Albumin/Globulin Ratio: 1.4 (ref 1.2–2.2)
Albumin: 3.6 g/dL — ABNORMAL LOW (ref 4.0–5.0)
Alkaline Phosphatase: 202 IU/L — ABNORMAL HIGH (ref 42–106)
BUN/Creatinine Ratio: 6 — ABNORMAL LOW (ref 9–23)
BUN: 5 mg/dL — ABNORMAL LOW (ref 6–20)
Bilirubin Total: 0.2 mg/dL (ref 0.0–1.2)
CO2: 17 mmol/L — ABNORMAL LOW (ref 20–29)
Calcium: 9.6 mg/dL (ref 8.7–10.2)
Chloride: 104 mmol/L (ref 96–106)
Creatinine, Ser: 0.78 mg/dL (ref 0.57–1.00)
Globulin, Total: 2.6 g/dL (ref 1.5–4.5)
Glucose: 81 mg/dL (ref 70–99)
Potassium: 3.9 mmol/L (ref 3.5–5.2)
Sodium: 139 mmol/L (ref 134–144)
Total Protein: 6.2 g/dL (ref 6.0–8.5)
eGFR: 111 mL/min/{1.73_m2} (ref >59)

## 2022-10-13 LAB — CBC
Hematocrit: 35.9 % (ref 34.0–46.6)
Hemoglobin: 12.6 g/dL (ref 11.1–15.9)
MCH: 31.8 pg (ref 26.6–33.0)
MCHC: 35.1 g/dL (ref 31.5–35.7)
MCV: 91 fL (ref 79–97)
Platelets: 257 x10E3/uL (ref 150–450)
RBC: 3.96 x10E6/uL (ref 3.77–5.28)
RDW: 12.4 % (ref 11.7–15.4)
WBC: 14.7 x10E3/uL — ABNORMAL HIGH (ref 3.4–10.8)

## 2022-10-13 LAB — PROTEIN / CREATININE RATIO, URINE
Creatinine, Urine: 78.5 mg/dL
Protein, Ur: 20.4 mg/dL
Protein/Creat Ratio: 260 mg/g creat — ABNORMAL HIGH (ref 0–200)

## 2022-10-14 ENCOUNTER — Inpatient Hospital Stay (HOSPITAL_COMMUNITY): Payer: Medicaid Other | Admitting: Anesthesiology

## 2022-10-14 ENCOUNTER — Inpatient Hospital Stay (HOSPITAL_COMMUNITY): Payer: Medicaid Other

## 2022-10-14 ENCOUNTER — Other Ambulatory Visit: Payer: Self-pay

## 2022-10-14 ENCOUNTER — Inpatient Hospital Stay (HOSPITAL_COMMUNITY)
Admission: RE | Admit: 2022-10-14 | Discharge: 2022-10-17 | DRG: 806 | Disposition: A | Discharge status: Home / Self Care | Payer: Medicaid Other | Attending: Obstetrics and Gynecology | Admitting: Obstetrics and Gynecology

## 2022-10-14 ENCOUNTER — Encounter (HOSPITAL_COMMUNITY): Payer: Self-pay | Admitting: Obstetrics & Gynecology

## 2022-10-14 DIAGNOSIS — E876 Hypokalemia: Secondary | ICD-10-CM | POA: Diagnosis present

## 2022-10-14 DIAGNOSIS — N179 Acute kidney failure, unspecified: Secondary | ICD-10-CM | POA: Diagnosis not present

## 2022-10-14 DIAGNOSIS — O139 Gestational [pregnancy-induced] hypertension without significant proteinuria, unspecified trimester: Secondary | ICD-10-CM | POA: Diagnosis present

## 2022-10-14 DIAGNOSIS — O1414 Severe pre-eclampsia complicating childbirth: Secondary | ICD-10-CM | POA: Diagnosis not present

## 2022-10-14 DIAGNOSIS — O99284 Endocrine, nutritional and metabolic diseases complicating childbirth: Secondary | ICD-10-CM | POA: Diagnosis not present

## 2022-10-14 DIAGNOSIS — O99892 Other specified diseases and conditions complicating childbirth: Secondary | ICD-10-CM | POA: Diagnosis not present

## 2022-10-14 DIAGNOSIS — D62 Acute posthemorrhagic anemia: Secondary | ICD-10-CM | POA: Diagnosis not present

## 2022-10-14 DIAGNOSIS — Z3A37 37 weeks gestation of pregnancy: Secondary | ICD-10-CM | POA: Diagnosis not present

## 2022-10-14 DIAGNOSIS — O99214 Obesity complicating childbirth: Secondary | ICD-10-CM | POA: Diagnosis not present

## 2022-10-14 DIAGNOSIS — O9081 Anemia of the puerperium: Secondary | ICD-10-CM | POA: Diagnosis not present

## 2022-10-14 DIAGNOSIS — O133 Gestational [pregnancy-induced] hypertension without significant proteinuria, third trimester: Secondary | ICD-10-CM

## 2022-10-14 DIAGNOSIS — Z8759 Personal history of other complications of pregnancy, childbirth and the puerperium: Secondary | ICD-10-CM | POA: Diagnosis not present

## 2022-10-14 DIAGNOSIS — Z3400 Encounter for supervision of normal first pregnancy, unspecified trimester: Secondary | ICD-10-CM

## 2022-10-14 DIAGNOSIS — O134 Gestational [pregnancy-induced] hypertension without significant proteinuria, complicating childbirth: Secondary | ICD-10-CM | POA: Diagnosis not present

## 2022-10-14 DIAGNOSIS — Z7982 Long term (current) use of aspirin: Secondary | ICD-10-CM | POA: Diagnosis not present

## 2022-10-14 DIAGNOSIS — Z30017 Encounter for initial prescription of implantable subdermal contraceptive: Secondary | ICD-10-CM | POA: Diagnosis not present

## 2022-10-14 DIAGNOSIS — O1410 Severe pre-eclampsia, unspecified trimester: Secondary | ICD-10-CM | POA: Diagnosis not present

## 2022-10-14 DIAGNOSIS — E669 Obesity, unspecified: Secondary | ICD-10-CM | POA: Diagnosis not present

## 2022-10-14 LAB — CBC
HCT: 33.6 % — ABNORMAL LOW (ref 36.0–46.0)
HCT: 33.6 % — ABNORMAL LOW (ref 36.0–46.0)
Hemoglobin: 11.8 g/dL — ABNORMAL LOW (ref 12.0–15.0)
Hemoglobin: 11.9 g/dL — ABNORMAL LOW (ref 12.0–15.0)
MCH: 32.6 pg (ref 26.0–34.0)
MCH: 32.8 pg (ref 26.0–34.0)
MCHC: 35.1 g/dL (ref 30.0–36.0)
MCHC: 35.4 g/dL (ref 30.0–36.0)
MCV: 92.8 fL (ref 80.0–100.0)
MCV: 92.6 fL (ref 80.0–100.0)
Platelets: 243 10*3/uL (ref 150–400)
Platelets: 254 10*3/uL (ref 150–400)
RBC: 3.62 MIL/uL — ABNORMAL LOW (ref 3.87–5.11)
RBC: 3.63 MIL/uL — ABNORMAL LOW (ref 3.87–5.11)
RDW: 12.5 % (ref 11.5–15.5)
RDW: 12.6 % (ref 11.5–15.5)
WBC: 13.9 10*3/uL — ABNORMAL HIGH (ref 4.0–10.5)
WBC: 16.7 10*3/uL — ABNORMAL HIGH (ref 4.0–10.5)
nRBC: 0 % (ref 0.0–0.2)
nRBC: 0 % (ref 0.0–0.2)

## 2022-10-14 LAB — CERVICOVAGINAL ANCILLARY ONLY
Chlamydia: NEGATIVE
Comment: NEGATIVE
Comment: NORMAL
Neisseria Gonorrhea: NEGATIVE

## 2022-10-14 LAB — PROTEIN / CREATININE RATIO, URINE
Creatinine, Urine: 109 mg/dL
Protein Creatinine Ratio: 0.31 mg/mg{Cre} — ABNORMAL HIGH (ref 0.00–0.15)
Total Protein, Urine: 34 mg/dL

## 2022-10-14 LAB — COMPREHENSIVE METABOLIC PANEL
ALT: 11 U/L (ref 0–44)
AST: 19 U/L (ref 15–41)
Albumin: 2.6 g/dL — ABNORMAL LOW (ref 3.5–5.0)
Alkaline Phosphatase: 134 U/L — ABNORMAL HIGH (ref 38–126)
Anion gap: 15 (ref 5–15)
BUN: <5 mg/dL — ABNORMAL LOW (ref 6–20)
CO2: 17 mmol/L — ABNORMAL LOW (ref 22–32)
Calcium: 8.3 mg/dL — ABNORMAL LOW (ref 8.9–10.3)
Chloride: 104 mmol/L (ref 98–111)
Creatinine, Ser: 0.78 mg/dL (ref 0.44–1.00)
GFR, Estimated: >60 mL/min (ref >60)
Glucose, Bld: 83 mg/dL (ref 70–99)
Potassium: 3.2 mmol/L — ABNORMAL LOW (ref 3.5–5.1)
Sodium: 136 mmol/L (ref 135–145)
Total Bilirubin: 0.5 mg/dL (ref 0.3–1.2)
Total Protein: 6 g/dL — ABNORMAL LOW (ref 6.5–8.1)

## 2022-10-14 LAB — TYPE AND SCREEN
ABO/RH(D): O POS
Antibody Screen: NEGATIVE

## 2022-10-14 LAB — RPR: RPR Ser Ql: NONREACTIVE

## 2022-10-14 LAB — GROUP B STREP BY PCR: Group B strep by PCR: NEGATIVE

## 2022-10-14 MED ORDER — LACTATED RINGERS IV SOLN: INTRAVENOUS | Status: DC

## 2022-10-14 MED ORDER — LABETALOL HCL 5 MG/ML IV SOLN: 40.0000 mg | INTRAVENOUS | Status: DC | PRN

## 2022-10-14 MED ORDER — TERBUTALINE SULFATE 1 MG/ML IJ SOLN: 0.2500 mg | Freq: Once | INTRAMUSCULAR | Status: DC | PRN

## 2022-10-14 MED ORDER — POTASSIUM CHLORIDE CRYS ER 20 MEQ PO TBCR
40.0000 meq | EXTENDED_RELEASE_TABLET | Freq: Once | ORAL | Status: AC
  Administered 2022-10-14: 40 meq via ORAL
  Filled 2022-10-14: qty 2

## 2022-10-14 MED ORDER — LABETALOL HCL 5 MG/ML IV SOLN
20.0000 mg | INTRAVENOUS | Status: DC | PRN
  Administered 2022-10-14: 20 mg via INTRAVENOUS

## 2022-10-14 MED ORDER — MAGNESIUM SULFATE 40 GM/1000ML IV SOLN
2.0000 g/h | INTRAVENOUS | Status: DC
  Administered 2022-10-14: 2 g/h via INTRAVENOUS
  Filled 2022-10-14: qty 1000

## 2022-10-14 MED ORDER — PHENYLEPHRINE 80 MCG/ML (10ML) SYRINGE FOR IV PUSH (FOR BLOOD PRESSURE SUPPORT): 80.0000 ug | PREFILLED_SYRINGE | INTRAVENOUS | Status: DC | PRN

## 2022-10-14 MED ORDER — SOD CITRATE-CITRIC ACID 500-334 MG/5ML PO SOLN: 30.0000 mL | ORAL | Status: DC | PRN

## 2022-10-14 MED ORDER — MISOPROSTOL 25 MCG QUARTER TABLET
25.0000 ug | ORAL_TABLET | Freq: Once | ORAL | Status: AC
  Administered 2022-10-14: 25 ug via VAGINAL
  Filled 2022-10-14: qty 1

## 2022-10-14 MED ORDER — MISOPROSTOL 50MCG HALF TABLET
50.0000 ug | ORAL_TABLET | Freq: Once | ORAL | Status: AC
  Administered 2022-10-14: 50 ug via ORAL
  Filled 2022-10-14: qty 1

## 2022-10-14 MED ORDER — EPHEDRINE 5 MG/ML INJ: 10.0000 mg | INTRAVENOUS | Status: DC | PRN

## 2022-10-14 MED ORDER — OXYCODONE-ACETAMINOPHEN 5-325 MG PO TABS: 2.0000 | ORAL_TABLET | ORAL | Status: DC | PRN

## 2022-10-14 MED ORDER — FENTANYL CITRATE (PF) 100 MCG/2ML IJ SOLN
50.0000 ug | INTRAMUSCULAR | Status: DC | PRN
  Administered 2022-10-14 (×2): 100 ug via INTRAVENOUS
  Filled 2022-10-14 (×2): qty 2

## 2022-10-14 MED ORDER — OXYTOCIN-SODIUM CHLORIDE 30-0.9 UT/500ML-% IV SOLN
1.0000 m[IU]/min | INTRAVENOUS | Status: DC
  Administered 2022-10-14: 2 m[IU]/min via INTRAVENOUS
  Filled 2022-10-14: qty 500

## 2022-10-14 MED ORDER — MAGNESIUM SULFATE BOLUS VIA INFUSION
4.0000 g | Freq: Once | INTRAVENOUS | Status: AC
  Administered 2022-10-14: 4 g via INTRAVENOUS
  Filled 2022-10-14: qty 1000

## 2022-10-14 MED ORDER — OXYTOCIN-SODIUM CHLORIDE 30-0.9 UT/500ML-% IV SOLN
2.5000 [IU]/h | INTRAVENOUS | Status: DC
  Administered 2022-10-15: 2.5 [IU]/h via INTRAVENOUS
  Filled 2022-10-14: qty 500

## 2022-10-14 MED ORDER — ACETAMINOPHEN 325 MG PO TABS: 650.0000 mg | ORAL_TABLET | ORAL | Status: DC | PRN

## 2022-10-14 MED ORDER — LABETALOL HCL 5 MG/ML IV SOLN
INTRAVENOUS | Status: AC
  Filled 2022-10-14: qty 4

## 2022-10-14 MED ORDER — LIDOCAINE HCL (PF) 1 % IJ SOLN
INTRAMUSCULAR | Status: DC | PRN
  Administered 2022-10-14: 10 mL via EPIDURAL

## 2022-10-14 MED ORDER — LACTATED RINGERS IV SOLN
500.0000 mL | Freq: Once | INTRAVENOUS | Status: AC
  Administered 2022-10-14: 500 mL via INTRAVENOUS

## 2022-10-14 MED ORDER — FENTANYL-BUPIVACAINE-NACL 0.5-0.125-0.9 MG/250ML-% EP SOLN
12.0000 mL/h | EPIDURAL | Status: DC | PRN
  Administered 2022-10-14: 12 mL/h via EPIDURAL
  Filled 2022-10-14: qty 250

## 2022-10-14 MED ORDER — ONDANSETRON HCL 4 MG/2ML IJ SOLN
4.0000 mg | Freq: Four times a day (QID) | INTRAMUSCULAR | Status: DC | PRN
  Administered 2022-10-14: 4 mg via INTRAVENOUS
  Filled 2022-10-14: qty 2

## 2022-10-14 MED ORDER — DIPHENHYDRAMINE HCL 50 MG/ML IJ SOLN: 12.5000 mg | INTRAMUSCULAR | Status: DC | PRN

## 2022-10-14 MED ORDER — LACTATED RINGERS IV SOLN: 500.0000 mL | INTRAVENOUS | Status: DC | PRN

## 2022-10-14 MED ORDER — HYDRALAZINE HCL 20 MG/ML IJ SOLN: 10.0000 mg | INTRAMUSCULAR | Status: DC | PRN

## 2022-10-14 MED ORDER — PHENYLEPHRINE 80 MCG/ML (10ML) SYRINGE FOR IV PUSH (FOR BLOOD PRESSURE SUPPORT)
80.0000 ug | PREFILLED_SYRINGE | INTRAVENOUS | Status: DC | PRN
  Filled 2022-10-14: qty 10

## 2022-10-14 MED ORDER — LABETALOL HCL 5 MG/ML IV SOLN: 80.0000 mg | INTRAVENOUS | Status: DC | PRN

## 2022-10-14 MED ORDER — OXYCODONE-ACETAMINOPHEN 5-325 MG PO TABS: 1.0000 | ORAL_TABLET | ORAL | Status: DC | PRN

## 2022-10-14 MED ORDER — LIDOCAINE HCL (PF) 1 % IJ SOLN: 30.0000 mL | INTRAMUSCULAR | Status: DC | PRN

## 2022-10-14 MED ORDER — OXYTOCIN BOLUS FROM INFUSION
333.0000 mL | Freq: Once | INTRAVENOUS | Status: AC
  Administered 2022-10-15: 333 mL via INTRAVENOUS

## 2022-10-14 NOTE — Anesthesia Preprocedure Evaluation (Signed)
Anesthesia Evaluation  Patient identified by MRN, date of birth, ID band Patient awake    Reviewed: Allergy & Precautions, H&P , Patient's Chart, lab work & pertinent test results  Airway Mallampati: II       Dental no notable dental hx.    Pulmonary    Pulmonary exam normal        Cardiovascular Normal cardiovascular exam     Neuro/Psych negative neurological ROS  negative psych ROS   GI/Hepatic negative GI ROS, Neg liver ROS,,,  Endo/Other  negative endocrine ROS    Renal/GU negative Renal ROS  negative genitourinary   Musculoskeletal negative musculoskeletal ROS (+)    Abdominal  (+) + obese  Peds  Hematology negative hematology ROS (+)   Anesthesia Other Findings   Reproductive/Obstetrics (+) Pregnancy                             Anesthesia Physical Anesthesia Plan  ASA: 3  Anesthesia Plan: Epidural   Post-op Pain Management:    Induction:   PONV Risk Score and Plan:   Airway Management Planned:   Additional Equipment:   Intra-op Plan:   Post-operative Plan:   Informed Consent: I have reviewed the patients History and Physical, chart, labs and discussed the procedure including the risks, benefits and alternatives for the proposed anesthesia with the patient or authorized representative who has indicated his/her understanding and acceptance.       Plan Discussed with:   Anesthesia Plan Comments:        Anesthesia Quick Evaluation

## 2022-10-14 NOTE — Anesthesia Procedure Notes (Signed)
Epidural Patient location during procedure: OB Start time: 10/14/2022 10:05 PM End time: 10/14/2022 10:09 PM  Staffing Anesthesiologist: Leilani Able, MD  Preanesthetic Checklist Completed: patient identified, IV checked, site marked, risks and benefits discussed, surgical consent, monitors and equipment checked, pre-op evaluation and timeout performed  Epidural Patient position: sitting Prep: DuraPrep and site prepped and draped Patient monitoring: continuous pulse ox and blood pressure Approach: midline Location: L3-L4 Injection technique: LOR air  Needle:  Needle type: Tuohy  Needle gauge: 17 G Needle length: 9 cm and 9 Needle insertion depth: 6 cm Catheter type: closed end flexible Catheter size: 19 Gauge Catheter at skin depth: 11 cm Test dose: negative and Other  Assessment Events: blood not aspirated, no cerebrospinal fluid, injection not painful, no injection resistance, no paresthesia and negative IV test

## 2022-10-14 NOTE — Progress Notes (Signed)
Labor Progress Note Paula Roberts is a 22 y.o. G1P0 at [redacted]w[redacted]d presented for IOL due to gHTN.  S: doing well. Doesn't feel contractions now. Now on 18 units of pitocin.   O:  BP (!) 141/91   Pulse 71   Temp 98.3 F (36.8 C) (Oral)   Resp 17   Ht 5' (1.524 m)   Wt 91.4 kg   LMP 01/28/2022   BMI 39.35 kg/m   EFM: 135 bpm, moderate variability, + accelerations, no decelerations   CVE: Dilation: 3.5 Effacement (%): 100 Station: -2 Presentation: Vertex Exam by:: Dr. Ladon Applebaum   A&P: 22 y.o. G1P0 [redacted]w[redacted]d IOL for pre-eclampsia. #Labor: Progressing well. S/p cytotec 50/25 X 1 round, on pitocin at 18 units. Minimal cervical change since last check. AROM performed, IUPC inserted to guide induction.  #Pain: family support, IV fentanyl, will like to hold off epidural as able #FWB: Cat 1 #GBS negative Pre-eclampsia w/o SF: Blood pressures elevated, but not in severe range. - CTM  - Plan for IV labetalol protocol and to start IV mag, if any other severe range pressures. - can also consider scheduled PO   HypoK - PO Kcl given  Sheppard Evens MD MPH OB Fellow, Faculty Practice [facility], Center for [facility] 10/14/2022

## 2022-10-14 NOTE — Progress Notes (Signed)
Labor Progress Note Paula Roberts is a 22 y.o. G1P0 at [redacted]w[redacted]d presented for IOL due to gHTN.  S: Comfortable with epidural   O:  BP (!) 150/97 (BP Location: Right Arm)   Pulse 78   Temp 97.9 F (36.6 C) (Oral)   Resp 16   Ht 5' (1.524 m)   Wt 91.4 kg   LMP 01/28/2022   BMI 39.35 kg/m   EFM: 140 bpm, moderate variability, + accelerations, no decelerations   CVE: Dilation: 4.5 Effacement (%): 90 Cervical Position: Middle Station: -2 Presentation: Vertex Exam by:: [place] Turner RN   A&P: 22 y.o. G1P0 [redacted]w[redacted]d IOL for pre-eclampsia. #Labor: progressing. S/p cytotec. On pitocin of 18 at this ti54me. IUPC replaced. Will titrate pitocin as able  #Pain: epidural in place  #FWB: Cat 1 #GBS negative Pre-eclampsia w. SF: Magnesium started due to severe range Bps   HypoK - PO Kcl given  Derrel Nip, MD  OB Fellow, Faculty Practice [facility], Center for [facility] 10/14/2022

## 2022-10-14 NOTE — Progress Notes (Addendum)
Labor Progress Note Paula Roberts is a 22 y.o. G1P0 at [redacted]w[redacted]d presented for IOL due to gHTN.  S:  doing well, feeling mild cramping. She had some severe range blood pressures earlier and improved with 1 dose of IV labetalol.  Labs show urine pc ratio 0.31, in keeping with pre-eclampsia. She has been asymptomatic.  O:  BP 136/89   Pulse 78   Temp 98.3 F (36.8 C) (Oral)   Resp 17   Ht 5' (1.524 m)   Wt 91.4 kg   LMP 01/28/2022   BMI 39.35 kg/m   EFM: 135bpm /moderate /+accels, no decels  CVE: Dilation: 3 Effacement (%): 80, 90 Station: -2 Presentation: Vertex Exam by:: Dr. Ladon Applebaum   A&P: 22 y.o. G1P0 [redacted]w[redacted]d IOL for pre-eclampsia. #Labor: Progressing well. S/p cytotec 50/25 X 1 round. Cervix thinning nicely and so will start pitocin 2 X 2. Consider AROM at next check.  #Pain: family support, IV fentanyl, will like to hold off epidural as able #FWB: Cat 1 #GBS negative Pre-eclampsia w/o SF: discussed new diagnosis. CMP and CBC unremarkable. No symptoms suggestive of severe fxs. Hasn't had severe range blood pressures since the episode earlier today, possibly related to anxiety. Will CTM.  - Plan for IV labetalol protocol and to start IV mag, if any other severe range pressures. - can also consider scheduled PO   HypoK - PO Kcl given  Sheppard Evens MD MPH OB Fellow, Faculty Practice [facility], Center for [facility] 10/14/2022

## 2022-10-14 NOTE — H&P (Signed)
OBSTETRIC ADMISSION HISTORY AND PHYSICAL  Paula Roberts is a 22 y.o. female G1P0 with IUP at [redacted]w[redacted]d by LMP presenting for induction of labor for gestational hypertension. She reports +FMs, No LOF, no VB, no blurry vision, headaches or peripheral edema, and RUQ pain.  She plans on bottle feeding. She request something, however unsure for birth control. 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   Hirsutism 07/24/2017   Poor sleep hygiene 12/25/2015   Esophageal reflux 12/25/2015   Keratosis pilaris 12/25/2015   Acne 12/16/2014     FAMILY TREE  RESULTS  Language English Pap <21  Initiated care at 8wks GC/CT Initial:   -/-         36wks:  Dating by LMP c/w 8wk [place]    Support person  Genetics NT/IT: neg    AFP:      Panorama: LR female  BP cuff rx'd Carrier Screen neg    [place]/Hgb Elec neg  Rhogam N/a    TDaP vaccine 10/14/22  Blood Type O/Positive/-- (12/18 1509)  Flu vaccine 10/14/22  Antibody Negative (04/02 0827)  Covid vaccine  HBsAg Negative (12/18 1509)    RPR Non Reactive (04/02 0827)  Anatomy US LVEICF, female 'Celeste' Rubella  1.58 (12/18 1509)  Feeding Plan bottle HIV Non Reactive (04/02 0827)  Contraception Condoms, no Phexxi Hep C neg  Circumcision N/a    Pediatrician [facility] A1C/GTT Early:  5.1    26-28wks: normal  Prenatal Classes discussed      GBS     [ ]  PCN allergy  BTL Consent N/a    VBAC Consent N/a PHQ9 & GAD7  [ ] New OB  [ ] 28wks   [ ] 36wks  Waterbirth [ ] Class [ ]  36wkCNM visit/consent       Past Medical  History: 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    PCO (polycystic ovaries)    Recurrent upper respiratory infection (URI)    Urinary tract infection     Past Surgical History: Past Surgical History:  Procedure Laterality Date   BREAST REDUCTION SURGERY Bilateral 04/24/2020   Procedure: MAMMARY REDUCTION  (BREAST);  Surgeon: Glenna Fellows, MD;  Location: [facility];  Service: Plastics;  Laterality: Bilateral;   KNEE ARTHROSCOPY WITH MEDIAL PATELLAR FEMORAL LIGAMENT RECONSTRUCTION Left 09/25/2019   Procedure: KNEE ARTHROSCOPY WITH MEDIAL PATELLAR FEMORAL LIGAMENT RECONSTRUCTION EXTRA ARTICULAR;  Surgeon: Bjorn Pippin, MD;  Location: [facility];  Service: Orthopedics;  Laterality: Left;   none     TONSILLECTOMY      Obstetrical History: OB History     Gravida  1   Para      Term      Preterm      AB      Living         SAB      IAB      Ectopic      Multiple      Live Births  Social History Social History   Socioeconomic History   Marital status: Single    Spouse name: Not on file   Number of children: Not on file   Years of education: Not on file   Highest education level: Not on file  Occupational History   Not on file  Tobacco Use   Smoking status: Never   Smokeless tobacco: Never  Vaping Use   Vaping Use: Former  Substance and Sexual Activity   Alcohol use: Never   Drug use: Never   Sexual activity: Yes    Birth control/protection: None  Other Topics Concern   Not on file  Social History Narrative   Lives with maternal grandparents (have officially completed work adopting her).   Graduated from [employer].          She would like to go to [employer] or [employer] for Criminal Psychology. She is working on applying for Environmental consultant.       Social Determinants of Health   Financial Resource Strain: Patient Declined  (03/28/2022)   Overall Financial Resource Strain (CARDIA)    Difficulty of Paying Living Expenses: Patient declined  Food Insecurity: No Food Insecurity (10/14/2022)   Hunger Vital Sign    Worried About Running Out of Food in the Last Year: Never true    Ran Out of Food in the Last Year: Never true  Transportation Needs: No Transportation Needs (10/14/2022)   PRAPARE - Administrator, Civil Service (Medical): No    Lack of Transportation (Non-Medical): No  Physical Activity: Sufficiently Active (03/28/2022)   Exercise Vital Sign    Days of Exercise per Week: 7 days    Minutes of Exercise per Session: 30 min  Stress: No Stress Concern Present (03/28/2022)   [employer] of Occupational Health - Occupational Stress Questionnaire    Feeling of Stress : Not at all  Social Connections: Moderately Isolated (03/28/2022)   Social Connection and Isolation Panel [NHANES]    Frequency of Communication with Friends and Family: More than three times a week    Frequency of Social Gatherings with Friends and Family: Once a week    Attends Religious Services: Never    Database administrator or Organizations: No    Attends Engineer, structural: Never    Marital Status: Living with partner    Family History: Family History  Adopted: Yes  Problem Relation Age of Onset   Diabetes Mother    Multiple sclerosis Mother    Kidney disease Maternal Grandmother    Scleroderma Maternal Grandmother    Diabetes Maternal Grandfather     Allergies: Allergies  Allergen Reactions   Grass Pollen(K-O-R-T-Swt Vern) Other (See Comments)    Grass, trees & mold. Patient has sinus infections because of these items    Medications Prior to Admission  Medication Sig Dispense Refill Last Dose   aspirin EC 81 MG tablet Take 1 tablet (81 mg total) by mouth daily. Swallow whole. 90 tablet 3 10/13/2022   Blood Pressure Monitor MISC For regular home bp monitoring during pregnancy 1 each 0 10/13/2022    Prenatal Vit-Fe Fumarate-FA (PRENATAL VITAMIN PO) Take by mouth.   10/13/2022   Fexofenadine HCl (ALLEGRA PO) Take by mouth. (Patient not taking: Reported on 09/27/2022)   More than a month     Review of Systems   All systems reviewed and negative except as stated in HPI  Blood pressure (!) 154/105, pulse 91, temperature 98.3 F (  36.8 C), temperature source Oral, resp. rate 17, last menstrual period 01/28/2022. General appearance: alert, cooperative, and appears stated age Lungs: clear to auscultation bilaterally Heart: regular rate and rhythm Abdomen: soft, non-tender; bowel sounds normal Pelvic: no lesions Extremities: Homans sign is negative, no sign of DVT Presentation: cephalic Fetal monitoring Baseline: 155 bpm, Variability: Good {> 6 bpm), Accelerations: Reactive, and Decelerations: Absent Uterine activityNone     Prenatal labs: ABO, Rh: O/Positive/-- (12/18 1509) Antibody: Negative (04/02 0827) Rubella: 1.58 (12/18 1509) RPR: Non Reactive (04/02 0827)  HBsAg: Negative (12/18 1509)  HIV: Non Reactive (04/02 0827)  GBS:    1 hr Glucola normal Genetic screening low risk Anatomy US normal  Prenatal Transfer Tool  Maternal Diabetes: No Genetic Screening: Normal Maternal Ultrasounds/Referrals: Normal Fetal Ultrasounds or other Referrals:  None Maternal Substance Abuse:  No Significant Maternal Medications:  None Significant Maternal Lab Results:  Other: GBS pending Number of Prenatal Visits:greater than 3 verified prenatal visits Other Comments:  None  No results found for this or any previous visit (from the past 24 hour(s)).  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 G1P0 at [redacted]w[redacted]d here for IOL gHTN  #Labor: Cyto 50/25 mcg PO/VAG #Pain: Per pt request #FWB: Category 1 #ID:  GBS pending #MOF: Bottle #MOC: Unsure #gHTN: PreE labs pending. CTM  Myrtie Hawk, DO  10/14/2022, 7:48 AM

## 2022-10-15 ENCOUNTER — Encounter (HOSPITAL_COMMUNITY): Payer: Self-pay | Admitting: Obstetrics & Gynecology

## 2022-10-15 DIAGNOSIS — O1414 Severe pre-eclampsia complicating childbirth: Secondary | ICD-10-CM

## 2022-10-15 DIAGNOSIS — Z8759 Personal history of other complications of pregnancy, childbirth and the puerperium: Secondary | ICD-10-CM | POA: Diagnosis not present

## 2022-10-15 DIAGNOSIS — Z3A37 37 weeks gestation of pregnancy: Secondary | ICD-10-CM

## 2022-10-15 DIAGNOSIS — O1410 Severe pre-eclampsia, unspecified trimester: Secondary | ICD-10-CM | POA: Diagnosis not present

## 2022-10-15 LAB — COMPREHENSIVE METABOLIC PANEL
ALT: 12 U/L (ref 0–44)
AST: 35 U/L (ref 15–41)
Albumin: 2.4 g/dL — ABNORMAL LOW (ref 3.5–5.0)
Alkaline Phosphatase: 166 U/L — ABNORMAL HIGH (ref 38–126)
Anion gap: 17 — ABNORMAL HIGH (ref 5–15)
BUN: 5 mg/dL — ABNORMAL LOW (ref 6–20)
CO2: 15 mmol/L — ABNORMAL LOW (ref 22–32)
Calcium: 7.5 mg/dL — ABNORMAL LOW (ref 8.9–10.3)
Chloride: 102 mmol/L (ref 98–111)
Creatinine, Ser: 1.28 mg/dL — ABNORMAL HIGH (ref 0.44–1.00)
GFR, Estimated: >60 mL/min (ref >60)
Glucose, Bld: 126 mg/dL — ABNORMAL HIGH (ref 70–99)
Potassium: 3.8 mmol/L (ref 3.5–5.1)
Sodium: 134 mmol/L — ABNORMAL LOW (ref 135–145)
Total Bilirubin: 0.4 mg/dL (ref 0.3–1.2)
Total Protein: 5.9 g/dL — ABNORMAL LOW (ref 6.5–8.1)

## 2022-10-15 LAB — CBC
HCT: 35.4 % — ABNORMAL LOW (ref 36.0–46.0)
Hemoglobin: 12 g/dL (ref 12.0–15.0)
MCH: 32.2 pg (ref 26.0–34.0)
MCHC: 33.9 g/dL (ref 30.0–36.0)
MCV: 94.9 fL (ref 80.0–100.0)
Platelets: 309 10*3/uL (ref 150–400)
RBC: 3.73 MIL/uL — ABNORMAL LOW (ref 3.87–5.11)
RDW: 12.8 % (ref 11.5–15.5)
WBC: 25.2 10*3/uL — ABNORMAL HIGH (ref 4.0–10.5)
nRBC: 0 % (ref 0.0–0.2)

## 2022-10-15 LAB — CULTURE, BETA STREP (GROUP B ONLY): Strep Gp B Culture: POSITIVE — AB

## 2022-10-15 LAB — MAGNESIUM: Magnesium: 6.8 mg/dL (ref 1.7–2.4)

## 2022-10-15 MED ORDER — MISOPROSTOL 200 MCG PO TABS
ORAL_TABLET | ORAL | Status: AC
  Filled 2022-10-15: qty 1

## 2022-10-15 MED ORDER — ZOLPIDEM TARTRATE 5 MG PO TABS: 5.0000 mg | ORAL_TABLET | Freq: Every evening | ORAL | Status: DC | PRN

## 2022-10-15 MED ORDER — NIFEDIPINE ER OSMOTIC RELEASE 30 MG PO TB24
30.0000 mg | ORAL_TABLET | Freq: Every day | ORAL | Status: DC
  Administered 2022-10-15: 30 mg via ORAL
  Filled 2022-10-15: qty 1

## 2022-10-15 MED ORDER — MAGNESIUM SULFATE 40 GM/1000ML IV SOLN
2.0000 g/h | INTRAVENOUS | Status: AC
  Administered 2022-10-15: 2 g/h via INTRAVENOUS
  Filled 2022-10-15: qty 1000

## 2022-10-15 MED ORDER — TRANEXAMIC ACID-NACL 1000-0.7 MG/100ML-% IV SOLN: 1000.0000 mg | INTRAVENOUS | Status: DC

## 2022-10-15 MED ORDER — SODIUM CHLORIDE 0.9% FLUSH: 3.0000 mL | INTRAVENOUS | Status: DC | PRN

## 2022-10-15 MED ORDER — CEFAZOLIN SODIUM-DEXTROSE 2-4 GM/100ML-% IV SOLN
2.0000 g | Freq: Once | INTRAVENOUS | Status: AC
  Administered 2022-10-15: 2 g via INTRAVENOUS
  Filled 2022-10-15: qty 100

## 2022-10-15 MED ORDER — BENZOCAINE-MENTHOL 20-0.5 % EX AERO: 1.0000 | INHALATION_SPRAY | CUTANEOUS | Status: DC | PRN

## 2022-10-15 MED ORDER — ONDANSETRON HCL 4 MG PO TABS: 4.0000 mg | ORAL_TABLET | ORAL | Status: DC | PRN

## 2022-10-15 MED ORDER — SIMETHICONE 80 MG PO CHEW: 80.0000 mg | CHEWABLE_TABLET | ORAL | Status: DC | PRN

## 2022-10-15 MED ORDER — MISOPROSTOL 200 MCG PO TABS
1000.0000 ug | ORAL_TABLET | Freq: Once | ORAL | Status: AC
  Administered 2022-10-15: 1000 ug via RECTAL

## 2022-10-15 MED ORDER — SODIUM CHLORIDE 0.9% FLUSH
3.0000 mL | Freq: Two times a day (BID) | INTRAVENOUS | Status: DC
  Administered 2022-10-16 – 2022-10-17 (×2): 3 mL via INTRAVENOUS

## 2022-10-15 MED ORDER — OXYCODONE HCL 5 MG PO TABS: 5.0000 mg | ORAL_TABLET | ORAL | Status: DC | PRN

## 2022-10-15 MED ORDER — ONDANSETRON HCL 4 MG/2ML IJ SOLN: 4.0000 mg | INTRAMUSCULAR | Status: DC | PRN

## 2022-10-15 MED ORDER — TRANEXAMIC ACID-NACL 1000-0.7 MG/100ML-% IV SOLN
INTRAVENOUS | Status: AC
  Administered 2022-10-15: 1000 mg via INTRAVENOUS
  Filled 2022-10-15: qty 100

## 2022-10-15 MED ORDER — COCONUT OIL OIL: 1.0000 | TOPICAL_OIL | Status: DC | PRN

## 2022-10-15 MED ORDER — PRENATAL MULTIVITAMIN CH
1.0000 | ORAL_TABLET | Freq: Every day | ORAL | Status: DC
  Administered 2022-10-15 – 2022-10-17 (×3): 1 via ORAL
  Filled 2022-10-15 (×3): qty 1

## 2022-10-15 MED ORDER — SODIUM CHLORIDE 0.9 % IV SOLN
INTRAVENOUS | Status: DC | PRN
  Administered 2022-10-16: 20 mL/h via INTRAVENOUS

## 2022-10-15 MED ORDER — IBUPROFEN 600 MG PO TABS
600.0000 mg | ORAL_TABLET | Freq: Four times a day (QID) | ORAL | Status: DC
  Administered 2022-10-15 – 2022-10-17 (×7): 600 mg via ORAL
  Filled 2022-10-15 (×9): qty 1

## 2022-10-15 MED ORDER — DIBUCAINE (PERIANAL) 1 % EX OINT: 1.0000 | TOPICAL_OINTMENT | CUTANEOUS | Status: DC | PRN

## 2022-10-15 MED ORDER — SENNOSIDES-DOCUSATE SODIUM 8.6-50 MG PO TABS
2.0000 | ORAL_TABLET | ORAL | Status: DC
  Administered 2022-10-15 – 2022-10-17 (×3): 2 via ORAL
  Filled 2022-10-15 (×3): qty 2

## 2022-10-15 MED ORDER — DIPHENHYDRAMINE HCL 25 MG PO CAPS: 25.0000 mg | ORAL_CAPSULE | Freq: Four times a day (QID) | ORAL | Status: DC | PRN

## 2022-10-15 MED ORDER — ACETAMINOPHEN 325 MG PO TABS: 650.0000 mg | ORAL_TABLET | ORAL | Status: DC | PRN

## 2022-10-15 MED ORDER — FUROSEMIDE 20 MG PO TABS
20.0000 mg | ORAL_TABLET | Freq: Every day | ORAL | Status: DC
  Administered 2022-10-16 – 2022-10-17 (×2): 20 mg via ORAL
  Filled 2022-10-15 (×2): qty 1

## 2022-10-15 MED ORDER — WITCH HAZEL-GLYCERIN EX PADS: 1.0000 | MEDICATED_PAD | CUTANEOUS | Status: DC | PRN

## 2022-10-15 NOTE — Lactation Note (Signed)
This note was copied from a baby's chart. Lactation Consultation Note  Patient Name: Paula Roberts WGNFA'O Date: 10/15/2022 Age:20 hours Reason for consult: Initial assessment;Primapara;1st time breastfeeding;Early term 37-38.6wks;Breast reduction  Initial visit to 4 hours old infant. Breast reduction performed in 2021, lactating parent reports breast changes with pregnancy. Parent states her goal is to provide breast milk, preferably pumping only. Demonstrated hand expression, colostrum is easily expressed. Use manual pump with 21-mm flanges ( may need smaller flange) collected ~4-mL of colostrum. LC set up DEBP, educated parent about frequency, care of parts and breast milk storage. Infant is being bottlefed formula at nursing station (per parent) Discussed normal newborn behavior and patterns, signs of good milk transfer, hunger cues, tummy size and benefits of skin to skin.   Plan: 1-Feeding on demand or 8-12 times in 24h period. 2-Pump every 3h 3-Encouraged birthing parent rest, hydration and food intake.   Contact LC as needed for feeds/support/concerns/questions. All questions answered at this time. Provided Lactation services brochure and other resources.    Maternal Data Has patient been taught Hand Expression?: Yes Does the patient have breastfeeding experience prior to this delivery?: No  Feeding Mother's Current Feeding Choice: Breast Milk and Formula Nipple Type: Slow - flow  Lactation Tools Discussed/Used Tools: Pump;Flanges Flange Size: 21 (may need 18-mm or smaller) Breast pump type: Double-Electric Breast Pump;Manual Pump Education: Setup, frequency, and cleaning;Milk Storage Reason for Pumping: Br. Reduction Pumping frequency: every 3h Pumped volume: 4 mL (demonstrating manual pump)  Interventions Interventions: Breast feeding basics reviewed;Skin to skin;Hand express;Breast massage;Hand pump;DEBP;Expressed milk;Education;LC Services  brochure  Discharge Pump: Advised to call insurance company (May need a [facility] loaner or WIC pump) WIC Program: Yes  Consult Status Consult Status: Follow-up Date: 10/16/22 Follow-up type: In-patient    Paula Roberts A Paula Roberts 10/15/2022, 2:50 PM

## 2022-10-15 NOTE — Discharge Summary (Signed)
Postpartum Discharge Summary  Date of Service updated***     Patient Name: Paula Roberts DOB: March 19, 2001 MRN: [id number]  Date of admission: 10/14/2022 Delivery date:10/15/2022  Delivering provider: Alfredia Ferguson  Date of discharge: 10/15/2022  Admitting diagnosis: Gestational hypertension [O13.9] Intrauterine pregnancy: [redacted]w[redacted]d     Secondary diagnosis:  Principal Problem:   Vaginal delivery Active Problems:   Supervision of normal first pregnancy   Gestational hypertension   Severe pre-eclampsia  Additional problems: ***    Discharge diagnosis: {DX.:23714}                                              Post partum procedures:{Postpartum procedures:23558} Augmentation: Pitocin and Cytotec Complications: None  Hospital course: Induction of Labor With Vaginal Delivery   22 y.o. yo G1P0 at [redacted]w[redacted]d was admitted to the hospital 10/14/2022 for induction of labor.  Indication for induction: Gestational hypertension.  Patient had an labor course complicated by developing severe pre-eclampsia. Membrane Rupture Time/Date: 4:57 PM ,10/14/2022   Delivery Method:Vaginal, Spontaneous  Episiotomy: None  Lacerations:  None  Details of delivery can be found in separate delivery note.  Patient had a postpartum course complicated by***. Patient is discharged home 10/15/22.  Newborn Data: Birth date:10/15/2022  Birth time:9:42 AM  Gender:Female  Living status:Living  Apgars:8 ,9  Weight:2610 g   Magnesium Sulfate received: Yes: Seizure prophylaxis BMZ received: No Rhophylac:N/A MMR:N/A T-DaP:Given prenatally Flu: N/A Transfusion:{Transfusion received:30440034}  Physical exam  Vitals:   10/15/22 0800 10/15/22 0855 10/15/22 1000 10/15/22 1015  BP: (!) 150/87 (!) 154/85 (!) 142/81 (!) 135/99  Pulse: 75 89 100 98  Resp: 16  16   Temp:      TempSrc:      Weight:      Height:       General: {Exam; general:21111117} Lochia: {Desc;  appropriate/inappropriate:30686::"appropriate"} Uterine Fundus: {Desc; firm/soft:30687} Incision: {Exam; incision:21111123} DVT Evaluation: {Exam; dvt:2111122} Labs: Lab Results  Component Value Date   WBC 16.7 (H) 10/14/2022   HGB 11.9 (L) 10/14/2022   HCT 33.6 (L) 10/14/2022   MCV 92.6 10/14/2022   PLT 254 10/14/2022      Latest Ref Rng & Units 10/14/2022    6:37 AM  CMP  Glucose 70 - 99 mg/dL 83   BUN 6 - 20 mg/dL <5   Creatinine 8.29 - 1.00 mg/dL 5.62   Sodium 130 - 865 mmol/L 136   Potassium 3.5 - 5.1 mmol/L 3.2   Chloride 98 - 111 mmol/L 104   CO2 22 - 32 mmol/L 17   Calcium 8.9 - 10.3 mg/dL 8.3   Total Protein 6.5 - 8.1 g/dL 6.0   Total Bilirubin 0.3 - 1.2 mg/dL 0.5   Alkaline Phos 38 - 126 U/L 134   AST 15 - 41 U/L 19   ALT 0 - 44 U/L 11    Edinburgh Score:     No data to display           After visit meds:  Allergies as of 10/15/2022       Reactions   Grass Pollen(k-o-r-t-swt Vern) Other (See Comments)   Grass, trees & mold. Patient has sinus infections because of these items     Med Rec must be completed prior to using this [facility]***        Discharge home in stable condition Infant Feeding: {Baby  feeding:23562} Infant Disposition:{CHL IP OB HOME WITH ZOXWRU:04540} Discharge instruction: per After Visit Summary and Postpartum booklet. Activity: Advance as tolerated. Pelvic rest for 6 weeks.  Diet: {OB JWJX:[id number]} Future Appointments:No future appointments. Follow up Visit:   Please schedule this patient for a In person postpartum visit in 6 weeks with the following provider: Any provider. Additional Postpartum F/U:BP check 1 week  High risk pregnancy complicated by:  pre-eclampsia with severe features Delivery mode:  Vaginal, Spontaneous  Anticipated Birth Control:  Condoms   10/15/2022 Chiagoziem Lizabeth Leyden, MD

## 2022-10-16 LAB — COMPREHENSIVE METABOLIC PANEL
ALT: 9 U/L (ref 0–44)
AST: 25 U/L (ref 15–41)
Albumin: 2.2 g/dL — ABNORMAL LOW (ref 3.5–5.0)
Alkaline Phosphatase: 132 U/L — ABNORMAL HIGH (ref 38–126)
Anion gap: 8 (ref 5–15)
BUN: 5 mg/dL — ABNORMAL LOW (ref 6–20)
CO2: 21 mmol/L — ABNORMAL LOW (ref 22–32)
Calcium: 7.2 mg/dL — ABNORMAL LOW (ref 8.9–10.3)
Chloride: 108 mmol/L (ref 98–111)
Creatinine, Ser: 1.01 mg/dL — ABNORMAL HIGH (ref 0.44–1.00)
GFR, Estimated: >60 mL/min (ref >60)
Glucose, Bld: 98 mg/dL (ref 70–99)
Potassium: 3.5 mmol/L (ref 3.5–5.1)
Sodium: 137 mmol/L (ref 135–145)
Total Bilirubin: 0.3 mg/dL (ref 0.3–1.2)
Total Protein: 5.3 g/dL — ABNORMAL LOW (ref 6.5–8.1)

## 2022-10-16 LAB — CBC
HCT: 27.5 % — ABNORMAL LOW (ref 36.0–46.0)
Hemoglobin: 9.3 g/dL — ABNORMAL LOW (ref 12.0–15.0)
MCH: 32.7 pg (ref 26.0–34.0)
MCHC: 33.8 g/dL (ref 30.0–36.0)
MCV: 96.8 fL (ref 80.0–100.0)
Platelets: 248 10*3/uL (ref 150–400)
RBC: 2.84 MIL/uL — ABNORMAL LOW (ref 3.87–5.11)
RDW: 13.2 % (ref 11.5–15.5)
WBC: 20.5 10*3/uL — ABNORMAL HIGH (ref 4.0–10.5)
nRBC: 0 % (ref 0.0–0.2)

## 2022-10-16 MED ORDER — NIFEDIPINE ER OSMOTIC RELEASE 60 MG PO TB24
60.0000 mg | ORAL_TABLET | Freq: Every day | ORAL | Status: DC
  Administered 2022-10-16 – 2022-10-17 (×2): 60 mg via ORAL
  Filled 2022-10-16 (×2): qty 1

## 2022-10-16 MED ORDER — SODIUM CHLORIDE 0.9 % IV SOLN
500.0000 mg | Freq: Once | INTRAVENOUS | Status: AC
  Administered 2022-10-16: 500 mg via INTRAVENOUS
  Filled 2022-10-16: qty 25

## 2022-10-16 NOTE — Lactation Note (Signed)
This note was copied from a baby's chart. Lactation Consultation Note  Patient Name: Paula Roberts UJWJX'B Date: 10/16/2022 Age:20 hours Reason for consult: Follow-up assessment;Early term 37-38.6wks;Breast reduction  LC into room for follow up. Parent states she has been very tired, "time is moving really slow" and trying to catch up with sleep. Parent has not used DEBP since 6 pm on 6/8. Parent plans to wait until she is home to resume her pumping goals. LC reinforced the importance of breast stimulation specially after a breast reduction. LC offered resources for more information as [email or web address].org  to learn more about lactation after breast procedures/surgeries.   Plan: 1-Feeding on demand or 8-12 times in 24h period. 2-Encouraged maternal rest, hydration and food intake.  3-Pump at least 8x in 24h  Contact LC as needed for feeds/support/concerns/questions.   Maternal Data Has patient been taught Hand Expression?: Yes Does the patient have breastfeeding experience prior to this delivery?: No  Feeding Mother's Current Feeding Choice: Breast Milk and Formula Nipple Type: Extra Slow Flow  LATCH Score No latch observed during this encounter.   Lactation Tools Discussed/Used Tools: Pump;Flanges Flange Size: 21 Breast pump type: Double-Electric Breast Pump;Manual Pump Education: Milk Storage;Setup, frequency, and cleaning Reason for Pumping: Breast reduction, ETI Pumping frequency: at least 8x in 24h  Interventions Interventions: Breast feeding basics reviewed;Expressed milk;Hand pump;DEBP;Education  Discharge WIC Program: Yes  Consult Status Consult Status: Follow-up Date: 10/17/22 Follow-up type: In-patient    Konstantina Nachreiner A [name] 10/16/2022, 9:06 AM

## 2022-10-16 NOTE — Progress Notes (Signed)
Post Partum Day 1 s/p SVD c/b manual extraction of placenta, EBL 722cc Subjective: no complaints, up ad lib, voiding, and tolerating PO Feeling better of mag. Denies HA, visual changes, CP/SOB, RUQ/epigastric pain  Objective: Blood pressure (!) 155/91, pulse 85, temperature 98.3 F (36.8 C), temperature source Oral, resp. rate 17, height 5' (1.524 m), weight 91.4 kg, last menstrual period 01/28/2022, SpO2 99 %, unknown if currently breastfeeding.  Physical Exam:  General: alert, cooperative, appears stated age, and no distress Lochia: appropriate Uterine Fundus: firm DVT Evaluation: No evidence of DVT seen on physical exam.  Recent Labs    10/15/22 1046 10/16/22 0424  HGB 12.0 9.3*  HCT 35.4* 27.5*    Assessment/Plan: Postpartum - Contraception: Nexplanon - MOF: formula - Rh status: Rh+ - Rubella status: RI  Neonatal - female infant doing well at bedside  3. PreE w. SF (BP, Cr) - asymptomatic - s/p Mag 4/2 x 24h PP - serum labs notable for Cr 1.28, improved to 1.01 this AM - will stop trending. Remainder of labs reassuring - mild range Bps, will increase to procardia XL 60mg  daily - lasix x 5d PP  4. Acute blood loss anemia - Hgb 9.3 this AM, appropriate for EBL - IV venofer x 1 ordered  Dispo: continue postpartum management. anticipate discharge home PPD2   LOS: 2 days   Paula Roberts 10/16/2022, 12:45 PM

## 2022-10-16 NOTE — Anesthesia Postprocedure Evaluation (Signed)
Anesthesia Post Note  Patient: Paula Roberts  Procedure(s) Performed: AN AD HOC LABOR EPIDURAL     Patient location during evaluation: Mother Baby Anesthesia Type: Epidural Level of consciousness: awake and alert and oriented Pain management: satisfactory to patient Vital Signs Assessment: post-procedure vital signs reviewed and stable Respiratory status: respiratory function stable Cardiovascular status: stable Postop Assessment: no headache, no backache, epidural receding, patient able to bend at knees, no signs of nausea or vomiting, adequate PO intake and able to ambulate Anesthetic complications: no   No notable events documented.  Last Vitals:  Vitals:   10/16/22 0700 10/16/22 0829  BP:  (!) 146/86  Pulse:  93  Resp: 17 18  Temp:  36.7 C  SpO2:  97%    Last Pain:  Vitals:   10/16/22 0829  TempSrc: Oral  PainSc:    Pain Goal:                   Digby Groeneveld

## 2022-10-17 ENCOUNTER — Other Ambulatory Visit (HOSPITAL_COMMUNITY): Payer: Self-pay

## 2022-10-17 DIAGNOSIS — Z30017 Encounter for initial prescription of implantable subdermal contraceptive: Secondary | ICD-10-CM

## 2022-10-17 MED ORDER — IBUPROFEN 600 MG PO TABS
600.0000 mg | ORAL_TABLET | Freq: Four times a day (QID) | ORAL | 0 refills | Status: DC
  Filled 2022-10-17: qty 30, 8d supply, fill #0

## 2022-10-17 MED ORDER — NIFEDIPINE ER 60 MG PO TB24
60.0000 mg | ORAL_TABLET | Freq: Every day | ORAL | 0 refills | Status: DC
  Filled 2022-10-17: qty 30, 30d supply, fill #0

## 2022-10-17 MED ORDER — LIDOCAINE HCL 1 % IJ SOLN
0.0000 mL | Freq: Once | INTRAMUSCULAR | Status: AC | PRN
  Administered 2022-10-17: 3 mL via INTRADERMAL
  Filled 2022-10-17: qty 20

## 2022-10-17 MED ORDER — FUROSEMIDE 20 MG PO TABS
20.0000 mg | ORAL_TABLET | Freq: Every day | ORAL | 0 refills | Status: DC
  Filled 2022-10-17: qty 4, 4d supply, fill #0

## 2022-10-17 MED ORDER — ETONOGESTREL 68 MG ~~LOC~~ IMPL
68.0000 mg | DRUG_IMPLANT | Freq: Once | SUBCUTANEOUS | Status: AC
  Administered 2022-10-17: 68 mg via SUBCUTANEOUS
  Filled 2022-10-17: qty 1

## 2022-10-17 NOTE — Plan of Care (Signed)
?Problem: Education: ?Goal: Knowledge of General Education information will improve ?Description: Including pain rating scale, medication(s)/side effects and non-pharmacologic comfort measures ?Outcome: Adequate for Discharge ?  ?Problem: Health Behavior/Discharge Planning: ?Goal: Ability to manage health-related needs will improve ?Outcome: Adequate for Discharge ?  ?Problem: Clinical Measurements: ?Goal: Ability to maintain clinical measurements within normal limits will improve ?Outcome: Adequate for Discharge ?Goal: Will remain free from infection ?Outcome: Adequate for Discharge ?Goal: Diagnostic test results will improve ?Outcome: Adequate for Discharge ?Goal: Respiratory complications will improve ?Outcome: Adequate for Discharge ?Goal: Cardiovascular complication will be avoided ?Outcome: Adequate for Discharge ?  ?Problem: Activity: ?Goal: Risk for activity intolerance will decrease ?Outcome: Adequate for Discharge ?  ?Problem: Nutrition: ?Goal: Adequate nutrition will be maintained ?Outcome: Adequate for Discharge ?  ?Problem: Coping: ?Goal: Level of anxiety will decrease ?Outcome: Adequate for Discharge ?  ?Problem: Elimination: ?Goal: Will not experience complications related to bowel motility ?Outcome: Adequate for Discharge ?Goal: Will not experience complications related to urinary retention ?Outcome: Adequate for Discharge ?  ?Problem: Pain Managment: ?Goal: General experience of comfort will improve ?Outcome: Adequate for Discharge ?  ?Problem: Safety: ?Goal: Ability to remain free from injury will improve ?Outcome: Adequate for Discharge ?  ?Problem: Skin Integrity: ?Goal: Risk for impaired skin integrity will decrease ?Outcome: Adequate for Discharge ?  ?Problem: Education: ?Goal: Knowledge of Childbirth will improve ?Outcome: Adequate for Discharge ?Goal: Ability to make informed decisions regarding treatment and plan of care will improve ?Outcome: Adequate for Discharge ?Goal: Ability to state  and carry out methods to decrease the pain will improve ?Outcome: Adequate for Discharge ?Goal: Individualized Educational Video(s) ?Outcome: Adequate for Discharge ?  ?Problem: Coping: ?Goal: Ability to verbalize concerns and feelings about labor and delivery will improve ?Outcome: Adequate for Discharge ?  ?Problem: Life Cycle: ?Goal: Ability to make normal progression through stages of labor will improve ?Outcome: Adequate for Discharge ?Goal: Ability to effectively push during vaginal delivery will improve ?Outcome: Adequate for Discharge ?  ?Problem: Role Relationship: ?Goal: Will demonstrate positive interactions with the child ?Outcome: Adequate for Discharge ?  ?Problem: Safety: ?Goal: Risk of complications during labor and delivery will decrease ?Outcome: Adequate for Discharge ?  ?Problem: Pain Management: ?Goal: Relief or control of pain from uterine contractions will improve ?Outcome: Adequate for Discharge ?  ?Problem: Education: ?Goal: Knowledge of disease or condition will improve ?Outcome: Adequate for Discharge ?Goal: Knowledge of the prescribed therapeutic regimen will improve ?Outcome: Adequate for Discharge ?  ?Problem: Fluid Volume: ?Goal: Peripheral tissue perfusion will improve ?Outcome: Adequate for Discharge ?  ?Problem: Clinical Measurements: ?Goal: Complications related to disease process, condition or treatment will be avoided or minimized ?Outcome: Adequate for Discharge ?  ?Problem: Education: ?Goal: Knowledge of condition will improve ?Outcome: Adequate for Discharge ?Goal: Individualized Educational Video(s) ?Outcome: Adequate for Discharge ?Goal: Individualized Newborn Educational Video(s) ?Outcome: Adequate for Discharge ?  ?Problem: Activity: ?Goal: Will verbalize the importance of balancing activity with adequate rest periods ?Outcome: Adequate for Discharge ?Goal: Ability to tolerate increased activity will improve ?Outcome: Adequate for Discharge ?  ?Problem: Coping: ?Goal:  Ability to identify and utilize available resources and services will improve ?Outcome: Adequate for Discharge ?  ?Problem: Life Cycle: ?Goal: Chance of risk for complications during the postpartum period will decrease ?Outcome: Adequate for Discharge ?  ?Problem: Role Relationship: ?Goal: Ability to demonstrate positive interaction with newborn will improve ?Outcome: Adequate for Discharge ?  ?Problem: Skin Integrity: ?Goal: Demonstration of wound healing without infection will improve ?  Outcome: Adequate for Discharge ?  ?

## 2022-10-17 NOTE — Lactation Note (Addendum)
This note was copied from a baby's chart. Lactation Consultation Note  Patient Name: Paula Roberts [id number] Date: 10/17/2022 Age:20 hours Reason for consult: Follow-up assessment;Exclusive pumping and bottle feeding;Primapara;1st time breastfeeding;Breast reduction;Early term 37-38.6wks;Infant < 6lbs;Infant weight loss;Maternal endocrine disorder (2 % weight loss,) LC reviewed supply and demand , importance of being consistent with pumping around the clock with DEBP both breast for stimulation.  LC discussed with breast reductions it is a wait and see for volume of milk increasing. LC recommended prior to pumping hand express, pump 15 -20 mins and then hand express. Mom has the storage of breast milk handout.  See below for plans for the DEBP - Stork.  LC called down at 1100 am to [facility] and a Irena Cords DEBP has arrived for mom.  Maternal Data Has patient been taught Hand Expression?: Yes  Feeding Mother's Current Feeding Choice: Breast Milk and Formula Nipple Type: Extra Slow Flow  Lactation Tools Discussed/Used Tools: Pump;Flanges (per mom has not pumped since the DEBP was set up) Flange Size: 21 Breast pump type: Double-Electric Breast Pump;Manual Pump Education: Setup, frequency, and cleaning;Milk Storage  Interventions Interventions: Breast feeding basics reviewed;Education;LC Services brochure;LPT handout/interventions  Discharge Discharge Education: Engorgement and breast care;Warning signs for feeding baby Pump: Stork Pump (LC will send a Stork pump referral this am. Mom aware if she qualifies she will receive the DEBP prior to D/C.)  Consult Status Consult Status: Complete Date: 10/17/22    Kathrin Greathouse 10/17/2022, 8:41 AM

## 2022-10-17 NOTE — Progress Notes (Signed)
Post-Placental Nexplanon Insertion Procedure Note  Patient was identified. Informed consent was signed, signed copy in chart. A time-out was performed.    The insertion site was identified 8-10 cm (3-4 inches) from the medial epicondyle of the humerus and 3-5 cm (1.25-2 inches) posterior to (below) the sulcus (groove) between the biceps and triceps muscles of the patient's right arm and marked. The site was prepped and draped in the usual sterile fashion. Pt was prepped with alcohol swab and then injected with 3 cc of 1% lidocaine. The site was prepped with betadine. Nexplanon removed form packaging,  Device confirmed in needle, then inserted full length of needle and withdrawn per handbook instructions. Provider and patient verified presence of the implant in the woman's arm by palpation. Pt insertion site was covered with steristrips and pressure bandage. There was minimal blood loss. Patient tolerated procedure well.  Patient was given post procedure instructions and Nexplanon user card with expiration date. Condoms were recommended for STI prevention. Patient was asked to keep the pressure dressing on for 24 hours to minimize bruising and keep the adhesive bandage on for 3-5 days. The patient verbalized understanding of the plan of care and agrees.

## 2022-10-21 ENCOUNTER — Telehealth (INDEPENDENT_AMBULATORY_CARE_PROVIDER_SITE_OTHER): Payer: Medicaid Other | Admitting: *Deleted

## 2022-10-21 VITALS — BP 139/111

## 2022-10-21 DIAGNOSIS — Z8759 Personal history of other complications of pregnancy, childbirth and the puerperium: Secondary | ICD-10-CM

## 2022-10-21 NOTE — Progress Notes (Signed)
   NURSE VISIT- BLOOD PRESSURE CHECK  I connected with Paula Roberts on 10/21/2022 by MyChart video and verified that I am speaking with the correct person using two identifiers.   I discussed the limitations of evaluation and management by telemedicine. The patient expressed understanding and agreed to proceed.  Nurse is at the office, and patient is at home.  SUBJECTIVE:  Paula Roberts is a 22 y.o. G57P1001 female here for BP check. She is postpartum, delivery date 10/15/22     HYPERTENSION ROS:  Postpartum:  Severe headaches that don't go away with tylenol/other medicines: No  Visual changes (seeing spots/double/blurred vision) No  Severe pain under right breast breast or in center of upper chest No  Severe nausea/vomiting No  Taking medicines as instructed yes   OBJECTIVE:  BP (!) 139/111 (BP Location: Left Arm, Patient Position: Sitting, Cuff Size: Normal)   LMP 01/28/2022   Breastfeeding Yes   Appearance alert, well appearing, and in no distress.  ASSESSMENT: Postpartum  blood pressure check  PLAN: Discussed with Dr. Despina Hidden   Recommendations: no changes needed   Follow-up:  1 week for virtual bp check    Jobe Marker  10/21/2022 10:43 AM

## 2022-10-26 ENCOUNTER — Telehealth (INDEPENDENT_AMBULATORY_CARE_PROVIDER_SITE_OTHER): Payer: Medicaid Other | Admitting: *Deleted

## 2022-10-26 VITALS — BP 109/68

## 2022-10-26 DIAGNOSIS — Z8759 Personal history of other complications of pregnancy, childbirth and the puerperium: Secondary | ICD-10-CM

## 2022-10-26 NOTE — Progress Notes (Signed)
.    NURSE VISIT- BLOOD PRESSURE CHECK  I connected with Nabilah L Mcfann on 10/26/2022 by MyChart video and verified that I am speaking with the correct person using two identifiers.   I discussed the limitations of evaluation and management by telemedicine. The patient expressed understanding and agreed to proceed.  Nurse is at the office, and patient is at home.  SUBJECTIVE:  Paula Roberts is a 22 y.o. G12P1001 female here for BP check. She is postpartum, delivery date 10/15/22.     HYPERTENSION ROS:  Postpartum:  Severe headaches that don't go away with tylenol/other medicines: No  Visual changes (seeing spots/double/blurred vision) No  Severe pain under right breast breast or in center of upper chest No  Severe nausea/vomiting No  Taking medicines as instructed yes  OBJECTIVE:  BP 109/68 (BP Location: Left Arm, Patient Position: Sitting, Cuff Size: Normal)   LMP 01/28/2022   Appearance alert, well appearing, and in no distress.  ASSESSMENT: Postpartum  blood pressure check  PLAN: Discussed with Dr. Charlotta Newton   Recommendations: stop medicine 2 days before next visit   Follow-up: as scheduled   Jobe Marker  10/26/2022 3:01 PM

## 2022-11-11 ENCOUNTER — Encounter (INDEPENDENT_AMBULATORY_CARE_PROVIDER_SITE_OTHER): Payer: Self-pay

## 2022-11-16 ENCOUNTER — Encounter: Payer: Self-pay | Admitting: Advanced Practice Midwife

## 2022-11-16 ENCOUNTER — Ambulatory Visit (INDEPENDENT_AMBULATORY_CARE_PROVIDER_SITE_OTHER): Payer: Medicaid Other | Admitting: Advanced Practice Midwife

## 2022-11-16 NOTE — Progress Notes (Signed)
POSTPARTUM VISIT Patient name: Paula Roberts MRN [id number]  Date of birth: January 03, 2001 Chief Complaint:   Postpartum Care  History of Present Illness:   Paula Roberts is a 22 y.o. G48P1001 Caucasian female being seen today for a postpartum visit. She is  4.5  weeks postpartum following a spontaneous vaginal delivery at 37.1 gestational weeks. IOL: yes, for preeclampsia with severe features (P/C ratio 0.31; severe range BPs; mag sulfate x 24h PP). Anesthesia: epidural.  Laceration: none.  Complications: manual removal of placenta (Ancef 2gm x 1 for ppx). Inpatient contraception: yes Nexplanon inserted on PPD#2 .   Pregnancy complicated by initially gHTN at 34wks> pre-e w SF (BPs, P/C ratio) during IOL at 37wks . Tobacco use: no. Substance use disorder: no. Last pap smear: <21yo. Next pap smear due: March 2025 Patient's last menstrual period was 01/28/2022.  Postpartum course has been complicated by requiring Procardia XL 60mg  upon d/c from hospital; stopped a few days ago bc was having SBPs 70-80s . Bleeding flow about like a period. Bowel function is normal. Bladder function is normal. Urinary incontinence? no, fecal incontinence? no Patient is not sexually active. Last sexual activity: prior to birth of baby. Desired contraception: Nexplanon placed at hospital  . Patient does want a pregnancy in the future.  Desired family size is unsure number of children.   Upstream - 11/16/22 0943       Pregnancy Intention Screening   Does the patient want to become pregnant in the next year? No    Does the patient's partner want to become pregnant in the next year? No    Would the patient like to discuss contraceptive options today? No      Contraception Wrap Up   Current Method Hormonal Implant    End Method Hormonal Implant    Contraception Counseling Provided No            The pregnancy intention screening data noted above was reviewed. Potential methods of contraception were discussed.  The patient elected to proceed with Hormonal Implant.  Edinburgh Postpartum Depression Screening: negative  Edinburgh Postnatal Depression Scale - 11/16/22 1040       Edinburgh Postnatal Depression Scale:  In the Past 7 Days   I have been able to laugh and see the funny side of things. 0    I have looked forward with enjoyment to things. 0    I have blamed myself unnecessarily when things went wrong. 1    I have been anxious or worried for no good reason. 0    I have felt scared or panicky for no good reason. 0    Things have been getting on top of me. 0    I have been so unhappy that I have had difficulty sleeping. 0    I have felt sad or miserable. 0    I have been so unhappy that I have been crying. 0    The thought of harming myself has occurred to me. 0    Edinburgh Postnatal Depression Scale Total 1                04/25/2022    1:52 PM  GAD 7 : Generalized Anxiety Score  Nervous, Anxious, on Edge 0  Control/stop worrying 0  Worry too much - different things 0  Trouble relaxing 0  Restless 0  Easily annoyed or irritable 1  Afraid - awful might happen 0  Total GAD 7 Score 1  Baby's course has been uncomplicated. Baby is feeding by bottle. Infant has a pediatrician/family doctor? Yes.  Childcare strategy if returning to work/school: n/a-not working/going back to school.  Pt has material needs met for her and baby: Yes.   Review of Systems:   Pertinent items are noted in HPI Denies Abnormal vaginal discharge w/ itching/odor/irritation, headaches, visual changes, shortness of breath, chest pain, abdominal pain, severe nausea/vomiting, or problems with urination or bowel movements. Pertinent History Reviewed:  Reviewed past medical,surgical, obstetrical and family history.  Reviewed problem list, medications and allergies. OB History  Gravida Para Term Preterm AB Living  1 1 1     1   SAB IAB Ectopic Multiple Live Births        0 1    # Outcome Date GA Lbr  Len/2nd Weight Sex Delivery Anes PTL Lv  1 Term 10/15/22 [redacted]w[redacted]d / 04:43 5 lb 12.1 oz (2.61 kg) F Vag-Spont EPI  LIV   Physical Assessment:   Vitals:   11/16/22 0935  BP: 124/88  Pulse: 85  Weight: 176 lb 6.4 oz (80 kg)  Height: 5' (1.524 m)  Body mass index is 34.45 kg/m.       Physical Examination:   General appearance: alert, well appearing, and in no distress  Mental status: alert, oriented to person, place, and time  Skin: warm & dry   Cardiovascular: normal heart rate noted   Respiratory: normal respiratory effort, no distress   Breasts: deferred, no complaints   Abdomen: soft, non-tender   Pelvic: examination not indicated. Thin prep pap obtained: No  Rectal: not examined  Extremities: Edema: none         No results found for this or any previous visit (from the past 24 hour(s)).  Assessment & Plan:  1) Postpartum exam 2) 4.5 wks s/p spontaneous vaginal delivery 3) bottle feeding 4) Depression screening> negative 5) Contraception> Nexplanon placed in hospital on PPD#2 6) Hx pre-e w SF> BPs stable now  Essential components of care per ACOG recommendations:  1.  Mood and well being:  If positive depression screen, discussed and plan developed.  If using tobacco we discussed reduction/cessation and risk of relapse If current substance abuse, we discussed and referral to local resources was offered.   2. Infant care and feeding:  If breastfeeding, discussed returning to work, pumping, breastfeeding-associated pain, guidance regarding return to fertility while lactating if not using another method. If needed, patient was provided with a letter to be allowed to pump q 2-3hrs to support lactation in a private location with access to a refrigerator to store breastmilk.   Recommended that all caregivers be immunized for flu, pertussis and other preventable communicable diseases If pt does not have material needs met for her/baby, referred to local resources for help  obtaining these.  3. Sexuality, contraception and birth spacing Provided guidance regarding sexuality, management of dyspareunia, and resumption of intercourse Discussed avoiding interpregnancy interval <90mths and recommended birth spacing of 18 months  4. Sleep and fatigue Discussed coping options for fatigue and sleep disruption Encouraged family/partner/community support of 4 hrs of uninterrupted sleep to help with mood and fatigue  5. Physical recovery  If pt had a C/S, assessed incisional pain and providing guidance on normal vs prolonged recovery If pt had a laceration, perineal healing and pain reviewed.  If urinary or fecal incontinence, discussed management and referred to PT or uro/gyn if indicated  Patient is safe to resume physical activity. Discussed attainment of healthy weight.  6.  Chronic disease management Discussed pregnancy complications if any, and their implications for future childbearing and long-term maternal health. Review recommendations for prevention of recurrent pregnancy complications, such as 17 hydroxyprogesterone caproate to reduce risk for recurrent PTB not applicable, or aspirin to reduce risk of preeclampsia yes. Pt had GDM: no. If yes, 2hr GTT scheduled: not applicable. Reviewed medications and non-pregnant dosing including consideration of whether pt is breastfeeding using a reliable resource such as LactMed: not applicable Referred for f/u w/ PCP or subspecialist providers as indicated: no  7. Health maintenance Mammogram at 20yo or earlier if indicated Pap smears as indicated  Meds: No orders of the defined types were placed in this encounter.   Follow-up: Return for Pap & Physical April 2025.   No orders of the defined types were placed in this encounter.   Arabella Merles CNM 11/16/2022 10:50 AM

## 2022-11-16 NOTE — Patient Instructions (Signed)
Use the website www.[email or web address].net for postpartum resources. 

## 2023-01-19 ENCOUNTER — Encounter: Payer: Self-pay | Admitting: *Deleted

## 2023-08-01 DIAGNOSIS — M25572 Pain in left ankle and joints of left foot: Secondary | ICD-10-CM | POA: Diagnosis not present

## 2023-08-04 ENCOUNTER — Ambulatory Visit
Admission: RE | Admit: 2023-08-04 | Discharge: 2023-08-04 | Disposition: A | Discharge status: Home / Self Care | Source: Ambulatory Visit | Attending: Orthopaedic Surgery | Admitting: Orthopaedic Surgery

## 2023-08-04 ENCOUNTER — Other Ambulatory Visit: Payer: Self-pay | Admitting: Orthopaedic Surgery

## 2023-08-04 DIAGNOSIS — S92302A Fracture of unspecified metatarsal bone(s), left foot, initial encounter for closed fracture: Secondary | ICD-10-CM | POA: Diagnosis not present

## 2023-08-04 DIAGNOSIS — M79672 Pain in left foot: Secondary | ICD-10-CM

## 2023-08-04 DIAGNOSIS — S92355A Nondisplaced fracture of fifth metatarsal bone, left foot, initial encounter for closed fracture: Secondary | ICD-10-CM | POA: Diagnosis not present

## 2023-08-07 ENCOUNTER — Encounter (HOSPITAL_COMMUNITY): Payer: Self-pay | Admitting: Orthopaedic Surgery

## 2023-08-07 ENCOUNTER — Other Ambulatory Visit: Payer: Self-pay

## 2023-08-07 DIAGNOSIS — M7742 Metatarsalgia, left foot: Secondary | ICD-10-CM | POA: Diagnosis not present

## 2023-08-07 NOTE — Progress Notes (Signed)
 Surgery orders requested via Epic inbox.

## 2023-08-07 NOTE — Progress Notes (Addendum)
 COVID Vaccine Completed:  Date of COVID positive in last 90 days: No  PCP - N/A Cardiologist - N/A  Chest x-ray - N/A EKG - N/A Stress Test - N/A ECHO - N/A Cardiac Cath - N/A Pacemaker/ICD device last checked: Spinal Cord Stimulator:N/A  Bowel Prep - N/A  Sleep Study - N/A CPAP -   Fasting Blood Sugar - N/A Checks Blood Sugar _____ times a day  Last dose of GLP1 agonist-  N/A GLP1 instructions:  Hold 7 days before surgery    Last dose of SGLT-2 inhibitors-  N/A SGLT-2 instructions:  Hold 3 days before surgery    Blood Thinner Instructions:  N/A Aspirin Instructions: Last Dose:  Activity level:  Can go up a flight of stairs and perform activities of daily living without stopping and without symptoms of chest pain or shortness of breath.  Anesthesia review: N/A  Patient denies shortness of breath, fever, cough and chest pain at PAT appointment  Patient verbalized understanding of instructions that were given to them at the PAT appointment. Patient was also instructed that they will need to review over the PAT instructions again at home before surgery.

## 2023-08-09 NOTE — Anesthesia Preprocedure Evaluation (Signed)
 Anesthesia Evaluation  Patient identified by MRN, date of birth, ID band Patient awake    Reviewed: Allergy & Precautions, NPO status , Patient's Chart, lab work & pertinent test results  History of Anesthesia Complications Negative for: history of anesthetic complications  Airway Mallampati: II  TM Distance: >3 FB Neck ROM: Full    Dental no notable dental hx. (+) Teeth Intact, Dental Advisory Given   Pulmonary neg pulmonary ROS   Pulmonary exam normal breath sounds clear to auscultation       Cardiovascular hypertension, (-) angina (-) Past MI Normal cardiovascular exam Rhythm:Regular Rate:Normal     Neuro/Psych negative neurological ROS  negative psych ROS   GI/Hepatic   Endo/Other    Renal/GU      Musculoskeletal negative musculoskeletal ROS (+)    Abdominal   Peds  Hematology   Anesthesia Other Findings   Reproductive/Obstetrics negative OB ROS                              Anesthesia Physical Anesthesia Plan  ASA: 2  Anesthesia Plan: Regional and MAC   Post-op Pain Management: Regional block* and Minimal or no pain anticipated   Induction:   PONV Risk Score and Plan: Treatment may vary due to age or medical condition, Midazolam, Ondansetron and Propofol infusion  Airway Management Planned: Natural Airway and Nasal Cannula  Additional Equipment: None  Intra-op Plan:   Post-operative Plan:   Informed Consent: I have reviewed the patients History and Physical, chart, labs and discussed the procedure including the risks, benefits and alternatives for the proposed anesthesia with the patient or authorized representative who has indicated his/her understanding and acceptance.     Dental advisory given  Plan Discussed with: CRNA and Surgeon  Anesthesia Plan Comments: (L Popliteal + MAC)        Anesthesia Quick Evaluation

## 2023-08-10 ENCOUNTER — Other Ambulatory Visit: Payer: Self-pay

## 2023-08-10 ENCOUNTER — Ambulatory Visit (HOSPITAL_COMMUNITY)
Admission: RE | Admit: 2023-08-10 | Discharge: 2023-08-10 | Disposition: A | Discharge status: Home / Self Care | Attending: Orthopaedic Surgery | Admitting: Orthopaedic Surgery

## 2023-08-10 ENCOUNTER — Ambulatory Visit (HOSPITAL_COMMUNITY): Payer: Self-pay | Admitting: Anesthesiology

## 2023-08-10 ENCOUNTER — Encounter (HOSPITAL_COMMUNITY): Payer: Self-pay | Admitting: Orthopaedic Surgery

## 2023-08-10 ENCOUNTER — Encounter (HOSPITAL_COMMUNITY)
Admission: RE | Disposition: A | Discharge status: Home / Self Care | Payer: Self-pay | Source: Home / Self Care | Attending: Orthopaedic Surgery

## 2023-08-10 DIAGNOSIS — X58XXXA Exposure to other specified factors, initial encounter: Secondary | ICD-10-CM | POA: Diagnosis not present

## 2023-08-10 DIAGNOSIS — I10 Essential (primary) hypertension: Secondary | ICD-10-CM | POA: Insufficient documentation

## 2023-08-10 DIAGNOSIS — S92352A Displaced fracture of fifth metatarsal bone, left foot, initial encounter for closed fracture: Secondary | ICD-10-CM | POA: Insufficient documentation

## 2023-08-10 DIAGNOSIS — D649 Anemia, unspecified: Secondary | ICD-10-CM

## 2023-08-10 DIAGNOSIS — Z01818 Encounter for other preprocedural examination: Secondary | ICD-10-CM

## 2023-08-10 HISTORY — DX: Anemia, unspecified: D64.9

## 2023-08-10 HISTORY — DX: Unspecified pre-eclampsia, unspecified trimester: O14.90

## 2023-08-10 HISTORY — PX: ORIF TOE FRACTURE: SHX5032

## 2023-08-10 LAB — CBC
HCT: 42.5 % (ref 36.0–46.0)
Hemoglobin: 14.7 g/dL (ref 12.0–15.0)
MCH: 32.1 pg (ref 26.0–34.0)
MCHC: 34.6 g/dL (ref 30.0–36.0)
MCV: 92.8 fL (ref 80.0–100.0)
Platelets: 309 10*3/uL (ref 150–400)
RBC: 4.58 MIL/uL (ref 3.87–5.11)
RDW: 12.3 % (ref 11.5–15.5)
WBC: 9.1 10*3/uL (ref 4.0–10.5)
nRBC: 0 % (ref 0.0–0.2)

## 2023-08-10 LAB — POCT PREGNANCY, URINE: Preg Test, Ur: NEGATIVE

## 2023-08-10 SURGERY — OPEN REDUCTION INTERNAL FIXATION (ORIF) METATARSAL (TOE) FRACTURE
Anesthesia: Monitor Anesthesia Care | Site: Toe | Laterality: Left

## 2023-08-10 MED ORDER — DEXAMETHASONE SODIUM PHOSPHATE 10 MG/ML IJ SOLN
INTRAMUSCULAR | Status: DC | PRN
Start: 2023-08-10 — End: 2023-08-10
  Administered 2023-08-10: 8 mg via INTRAVENOUS

## 2023-08-10 MED ORDER — KETOROLAC TROMETHAMINE 30 MG/ML IJ SOLN: 30.0000 mg | Freq: Once | INTRAMUSCULAR | Status: DC | PRN

## 2023-08-10 MED ORDER — ONDANSETRON HCL 4 MG/2ML IJ SOLN
INTRAMUSCULAR | Status: DC | PRN
  Administered 2023-08-10: 4 mg via INTRAVENOUS

## 2023-08-10 MED ORDER — ASPIRIN 325 MG PO TABS: ORAL_TABLET | ORAL | 0 refills | Status: AC

## 2023-08-10 MED ORDER — OXYCODONE HCL 5 MG PO TABS
ORAL_TABLET | ORAL | Status: AC
  Filled 2023-08-10: qty 1

## 2023-08-10 MED ORDER — OXYCODONE HCL 5 MG PO TABS: 5.0000 mg | ORAL_TABLET | Freq: Once | ORAL | Status: DC | PRN

## 2023-08-10 MED ORDER — DEXMEDETOMIDINE HCL IN NACL 80 MCG/20ML IV SOLN
INTRAVENOUS | Status: DC | PRN
Start: 2023-08-10 — End: 2023-08-10
  Administered 2023-08-10: 12 ug via INTRAVENOUS

## 2023-08-10 MED ORDER — 0.9 % SODIUM CHLORIDE (POUR BTL) OPTIME
TOPICAL | Status: DC | PRN
  Administered 2023-08-10: 1000 mL

## 2023-08-10 MED ORDER — CLONIDINE HCL (ANALGESIA) 100 MCG/ML EP SOLN
EPIDURAL | Status: DC | PRN
  Administered 2023-08-10: 100 ug

## 2023-08-10 MED ORDER — EPHEDRINE SULFATE-NACL 50-0.9 MG/10ML-% IV SOSY
PREFILLED_SYRINGE | INTRAVENOUS | Status: DC | PRN
  Administered 2023-08-10: 7.5 mg via INTRAVENOUS

## 2023-08-10 MED ORDER — CEFAZOLIN SODIUM-DEXTROSE 1-4 GM/50ML-% IV SOLN
INTRAVENOUS | Status: DC | PRN
  Administered 2023-08-10: 2 g via INTRAVENOUS

## 2023-08-10 MED ORDER — HYDROMORPHONE HCL 1 MG/ML IJ SOLN: 0.2500 mg | INTRAMUSCULAR | Status: DC | PRN

## 2023-08-10 MED ORDER — ROPIVACAINE HCL 5 MG/ML IJ SOLN
INTRAMUSCULAR | Status: DC | PRN
  Administered 2023-08-10: 30 mL via PERINEURAL

## 2023-08-10 MED ORDER — PROPOFOL 500 MG/50ML IV EMUL
INTRAVENOUS | Status: DC | PRN
  Administered 2023-08-10: 70 ug/kg/min via INTRAVENOUS

## 2023-08-10 MED ORDER — CEFAZOLIN SODIUM 1 G IJ SOLR
INTRAMUSCULAR | Status: AC
  Filled 2023-08-10: qty 20

## 2023-08-10 MED ORDER — PROPOFOL 10 MG/ML IV BOLUS
INTRAVENOUS | Status: DC | PRN
  Administered 2023-08-10: 30 mg via INTRAVENOUS
  Administered 2023-08-10: 40 mg via INTRAVENOUS

## 2023-08-10 MED ORDER — FENTANYL CITRATE PF 50 MCG/ML IJ SOSY
50.0000 ug | PREFILLED_SYRINGE | INTRAMUSCULAR | Status: DC
  Administered 2023-08-10: 50 ug via INTRAVENOUS
  Filled 2023-08-10: qty 2

## 2023-08-10 MED ORDER — KETAMINE HCL 10 MG/ML IJ SOLN
INTRAMUSCULAR | Status: AC
  Filled 2023-08-10: qty 1

## 2023-08-10 MED ORDER — BUPIVACAINE HCL (PF) 0.5 % IJ SOLN
INTRAMUSCULAR | Status: AC
  Filled 2023-08-10: qty 30

## 2023-08-10 MED ORDER — OXYCODONE HCL 5 MG/5ML PO SOLN: 5.0000 mg | Freq: Once | ORAL | Status: DC | PRN

## 2023-08-10 MED ORDER — LACTATED RINGERS IV SOLN: INTRAVENOUS | Status: DC

## 2023-08-10 MED ORDER — OXYCODONE HCL 5 MG PO TABS: 5.0000 mg | ORAL_TABLET | ORAL | 0 refills | Status: AC | PRN

## 2023-08-10 MED ORDER — MIDAZOLAM HCL 2 MG/2ML IJ SOLN
1.0000 mg | INTRAMUSCULAR | Status: DC
Start: 2023-08-10 — End: 2023-08-10
  Administered 2023-08-10: 2 mg via INTRAVENOUS
  Filled 2023-08-10: qty 2

## 2023-08-10 MED ORDER — ONDANSETRON HCL 4 MG/2ML IJ SOLN: 4.0000 mg | Freq: Once | INTRAMUSCULAR | Status: DC | PRN

## 2023-08-10 MED ORDER — ORAL CARE MOUTH RINSE: 15.0000 mL | Freq: Once | OROMUCOSAL | Status: AC

## 2023-08-10 MED ORDER — GLYCOPYRROLATE 0.2 MG/ML IJ SOLN
INTRAMUSCULAR | Status: DC | PRN
  Administered 2023-08-10: .2 mg via INTRAVENOUS

## 2023-08-10 MED ORDER — CHLORHEXIDINE GLUCONATE 0.12 % MT SOLN
15.0000 mL | Freq: Once | OROMUCOSAL | Status: AC
  Administered 2023-08-10: 15 mL via OROMUCOSAL

## 2023-08-10 SURGICAL SUPPLY — 57 items
BAG COUNTER SPONGE SURGICOUNT (BAG) IMPLANT
BANDAGE ESMARK 6X9 LF (GAUZE/BANDAGES/DRESSINGS) IMPLANT
BENZOIN TINCTURE PRP APPL 2/3 (GAUZE/BANDAGES/DRESSINGS) IMPLANT
BIT DRILL SRG 3.5XCAN FFTH MTR (BIT) IMPLANT
BIT DRL SRG 3.5XCANN FIFTH MTR (BIT) ×1 IMPLANT
BLADE SURG 15 STRL LF DISP TIS (BLADE) ×2 IMPLANT
BNDG COHESIVE 4X5 TAN STRL LF (GAUZE/BANDAGES/DRESSINGS) IMPLANT
BNDG ELASTIC 4INX 5YD STR LF (GAUZE/BANDAGES/DRESSINGS) ×2 IMPLANT
BNDG ELASTIC 6INX 5YD STR LF (GAUZE/BANDAGES/DRESSINGS) IMPLANT
BNDG ESMARK 4X9 LF (GAUZE/BANDAGES/DRESSINGS) ×1 IMPLANT
BNDG ESMARK 6X9 LF (GAUZE/BANDAGES/DRESSINGS) IMPLANT
CHLORAPREP W/TINT 26 (MISCELLANEOUS) ×1 IMPLANT
COVER BACK TABLE 60X90IN (DRAPES) ×1 IMPLANT
CUFF TRNQT CYL 34X4.125X (TOURNIQUET CUFF) IMPLANT
DRAPE C-ARMOR (DRAPES) IMPLANT
DRAPE EXTREMITY T 121X128X90 (DISPOSABLE) ×1 IMPLANT
DRAPE IMP U-DRAPE 54X76 (DRAPES) ×1 IMPLANT
DRAPE OEC MINIVIEW 54X84 (DRAPES) ×1 IMPLANT
DRAPE SHEET LG 3/4 BI-LAMINATE (DRAPES) ×1 IMPLANT
DRAPE U-SHAPE 47X51 STRL (DRAPES) ×1 IMPLANT
DRSG XEROFORM 1X8 (GAUZE/BANDAGES/DRESSINGS) IMPLANT
ELECT REM PT RETURN 15FT ADLT (MISCELLANEOUS) ×1 IMPLANT
GAUZE SPONGE 4X4 12PLY STRL (GAUZE/BANDAGES/DRESSINGS) ×1 IMPLANT
GAUZE XEROFORM 1X8 LF (GAUZE/BANDAGES/DRESSINGS) ×1 IMPLANT
GLOVE BIOGEL PI IND STRL 8 (GLOVE) ×1 IMPLANT
GLOVE SURG LX STRL 7.5 STRW (GLOVE) ×1 IMPLANT
GOWN STRL REUS W/ TWL LRG LVL3 (GOWN DISPOSABLE) ×1 IMPLANT
GOWN STRL REUS W/ TWL XL LVL3 (GOWN DISPOSABLE) ×1 IMPLANT
GUIDEWIRE .07X8 (WIRE) IMPLANT
KIT BASIN OR (CUSTOM PROCEDURE TRAY) ×1 IMPLANT
KIT TURNOVER KIT A (KITS) IMPLANT
NDL SAFETY ECLIPSE 18X1.5 (NEEDLE) ×1 IMPLANT
NS IRRIG 1000ML POUR BTL (IV SOLUTION) ×1 IMPLANT
PAD CAST 4YDX4 CTTN HI CHSV (CAST SUPPLIES) ×1 IMPLANT
PADDING CAST COTTON 6X4 STRL (CAST SUPPLIES) IMPLANT
PADDING CAST SYNTHETIC 4X4 STR (CAST SUPPLIES) ×1 IMPLANT
PENCIL SMOKE EVACUATOR (MISCELLANEOUS) ×1 IMPLANT
SCREW LP PT 5.5X40MM (Screw) IMPLANT
SPIKE FLUID TRANSFER (MISCELLANEOUS) IMPLANT
SPLINT FIBERGLASS 4X30 (CAST SUPPLIES) ×1 IMPLANT
SPLINT PLASTER CAST XFAST 5X30 (CAST SUPPLIES) IMPLANT
SPONGE T-LAP 18X18 ~~LOC~~+RFID (SPONGE) IMPLANT
STOCKINETTE 6 STRL (DRAPES) ×1 IMPLANT
STRIP CLOSURE SKIN 1/2X4 (GAUZE/BANDAGES/DRESSINGS) IMPLANT
SUCTION TUBE FRAZIER 10FR DISP (SUCTIONS) ×1 IMPLANT
SUT ETHILON 3 0 PS 1 (SUTURE) ×1 IMPLANT
SUT FIBERWIRE 2-0 18 17.9 3/8 (SUTURE) IMPLANT
SUT MNCRL AB 3-0 PS2 18 (SUTURE) ×1 IMPLANT
SUT PDS AB 2-0 CT2 27 (SUTURE) ×1 IMPLANT
SUT VIC AB 3-0 FS2 27 (SUTURE) IMPLANT
SUTURE FIBERWR 2-0 18 17.9 3/8 (SUTURE) IMPLANT
SYR 10ML LL (SYRINGE) ×1 IMPLANT
SYR BULB EAR ULCER 3OZ GRN STR (SYRINGE) ×1 IMPLANT
TAP BONE CANN 4.5MM (BIT) IMPLANT
TAP CANN 5.5MM (BIT) IMPLANT
TOWEL OR 17X26 10 PK STRL BLUE (TOWEL DISPOSABLE) ×2 IMPLANT
TUBING CONNECTING 10 (TUBING) ×1 IMPLANT

## 2023-08-10 NOTE — Progress Notes (Signed)
 Orthopedic Tech Progress Note Patient Details:  Paula Roberts 2001-03-06 [id number]  Ortho Devices Type of Ortho Device: Crutches Ortho Device/Splint Interventions: Ordered, Application, Adjustment   Post Interventions Patient Tolerated: Well Instructions Provided: Adjustment of device, Care of device  Kizzie Fantasia 08/10/2023, 12:04 PM

## 2023-08-10 NOTE — H&P (Signed)
 PREOPERATIVE H&P  Chief Complaint: Left foot pain position.  HPI: Paula Roberts is a 23 y.o. female who presents for preoperative history and physical with a diagnosis of left zone 2 metatarsal fracture of the fifth metatarsal. Symptoms are rated as moderate to severe, and have been worsening.  This is significantly impairing activities of daily living.  She has elected for surgical management.   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    PCO (polycystic ovaries)    Preeclampsia    Resolved after delivery   Recurrent upper respiratory infection (URI)    Urinary tract infection    Past Surgical History:  Procedure Laterality Date   BREAST REDUCTION SURGERY Bilateral 04/24/2020   Procedure: MAMMARY REDUCTION  (BREAST);  Surgeon: Glenna Fellows, MD;  Location: [facility];  Service: Plastics;  Laterality: Bilateral;   KNEE ARTHROSCOPY WITH MEDIAL PATELLAR FEMORAL LIGAMENT RECONSTRUCTION Left 09/25/2019   Procedure: KNEE ARTHROSCOPY WITH MEDIAL PATELLAR FEMORAL LIGAMENT RECONSTRUCTION EXTRA ARTICULAR;  Surgeon: Bjorn Pippin, MD;  Location: [facility];  Service: Orthopedics;  Laterality: Left;   TONSILLECTOMY     Social History   Socioeconomic History   Marital status: Single    Spouse name: Not on file   Number of children: Not on file   Years of education: Not on file   Highest education level: Not on file  Occupational History   Not on file  Tobacco Use   Smoking status: Never   Smokeless tobacco: Never  Vaping Use   Vaping status: Every Day  Substance and Sexual Activity   Alcohol use: Never   Drug use: Never   Sexual activity: Not Currently    Birth control/protection: None  Other Topics Concern   Not on file  Social History Narrative   Lives with maternal grandparents (have officially completed work adopting her).   Graduated from [employer].           She would like to go to [employer] or [employer] for Criminal Psychology. She is working on applying for Environmental consultant.       Social Drivers of Health   Financial Resource Strain: Patient Declined (03/28/2022)   Overall Financial Resource Strain (CARDIA)    Difficulty of Paying Living Expenses: Patient declined  Food Insecurity: No Food Insecurity (10/14/2022)   Hunger Vital Sign    Worried About Running Out of Food in the Last Year: Never true    Ran Out of Food in the Last Year: Never true  Transportation Needs: No Transportation Needs (10/14/2022)   PRAPARE - Administrator, Civil Service (Medical): No    Lack of Transportation (Non-Medical): No  Physical Activity: Sufficiently Active (03/28/2022)   Exercise Vital Sign    Days of Exercise per Week: 7 days    Minutes of Exercise per Session: 30 min  Stress: No Stress Concern Present (03/28/2022)   [employer] of Occupational Health - Occupational Stress Questionnaire    Feeling of Stress : Not at all  Social Connections: Moderately Isolated (03/28/2022)   Social Connection and Isolation Panel [NHANES]    Frequency of Communication with Friends and Family: More than three times a week    Frequency of Social Gatherings with Friends and Family: Once a week    Attends Religious Services: Never    Database administrator or Organizations: No    Attends [employer]  or Organization Meetings: Never    Marital Status: Living with partner   Family History  Adopted: Yes  Problem Relation Age of Onset   Diabetes Mother    Multiple sclerosis Mother    Kidney disease Maternal Grandmother    Scleroderma Maternal Grandmother    Diabetes Maternal Grandfather    Allergies  Allergen Reactions   Grass Pollen(K-O-[id number]-T-Swt Vern) Other (See Comments)    Grass, trees & mold. Patient has sinus infections because of these items   Prior to Admission medications   Medication Sig Start Date End Date Taking? Authorizing Provider   ibuprofen (ADVIL) 800 MG tablet Take 800 mg by mouth every 8 (eight) hours as needed for mild pain (pain score 1-3) or moderate pain (pain score 4-6). 07/28/23  Yes [provider]  Multiple Vitamins-Minerals (MULTIVITAMIN WITH MINERALS) tablet Take 1 tablet by mouth daily.   Yes [provider]     Positive ROS: All other systems have been reviewed and were otherwise negative with the exception of those mentioned in the HPI and as above.  Physical Exam:  Vitals:   08/10/23 0909  BP: 123/74  Pulse: (!) 109  Resp: 16  Temp: 97.9 F (36.6 C)  SpO2: 98%   General: Alert, no acute distress Cardiovascular: No pedal edema Respiratory: No cyanosis, no use of accessory musculature GI: No organomegaly, abdomen is soft and non-tender Skin: No lesions in the area of chief complaint Neurologic: Sensation intact distally Psychiatric: Patient is competent for consent with normal mood and affect Lymphatic: No axillary or cervical lymphadenopathy  MUSCULOSKELETAL: Left foot demonstrate swelling laterally.  Tender to palpation along the fifth metatarsal.  No gross deformities.  Foot is warm and well-perfused with intact sensation.  Assessment: Left zone 2 metatarsal fracture of the fifth metatarsal   Plan: Plan for open treatment of her fifth metatarsal.  She will be discharged from the PACU..  We discussed the risks, benefits and alternatives of surgery which include but are not limited to wound healing complications, infection, nonunion, malunion, need for further surgery, damage to surrounding structures and continued pain.  They understand there is no guarantees to an acceptable outcome.  After weighing these risks they opted to proceed with surgery.     Terance Hart, MD    08/10/2023 9:48 AM

## 2023-08-10 NOTE — Anesthesia Postprocedure Evaluation (Signed)
 Anesthesia Post Note  Patient: Paula Roberts  Procedure(s) Performed: OPEN REDUCTION INTERNAL FIXATION (ORIF) METATARSAL (TOE) FRACTURE (Left: Toe)     Patient location during evaluation: PACU Anesthesia Type: Regional Level of consciousness: awake and alert Pain management: pain level controlled Vital Signs Assessment: post-procedure vital signs reviewed and stable Respiratory status: spontaneous breathing, nonlabored ventilation, respiratory function stable and patient connected to nasal cannula oxygen Cardiovascular status: stable and blood pressure returned to baseline Postop Assessment: no apparent nausea or vomiting Anesthetic complications: no  No notable events documented.  Last Vitals:  Vitals:   08/10/23 1200 08/10/23 1201  BP: 110/68 110/68  Pulse: 66   Resp:  16  Temp:  36.8 C  SpO2: 100%     Last Pain:  Vitals:   08/10/23 1201  TempSrc: Oral  PainSc:                  Trevor Iha

## 2023-08-10 NOTE — Transfer of Care (Signed)
 Immediate Anesthesia Transfer of Care Note  Patient: Paula Roberts  Procedure(s) Performed: OPEN REDUCTION INTERNAL FIXATION (ORIF) METATARSAL (TOE) FRACTURE (Left: Toe)  Patient Location: PACU  Anesthesia Type:General  Level of Consciousness: awake, alert , oriented, and patient cooperative  Airway & Oxygen Therapy: Patient Spontanous Breathing and Patient connected to face mask oxygen  Post-op Assessment: Report given to RN and Post -op Vital signs reviewed and stable  Post vital signs: Reviewed and stable  Last Vitals:  Vitals Value Taken Time  BP 117/75 08/10/23 1111  Temp    Pulse 66 08/10/23 1114  Resp 13 08/10/23 1114  SpO2 100 % 08/10/23 1114  Vitals shown include unfiled device data.  Last Pain:  Vitals:   08/10/23 1000  TempSrc:   PainSc: 0-No pain         Complications: No notable events documented.

## 2023-08-10 NOTE — Discharge Instructions (Signed)
 DR. Susa Simmonds FOOT [facility] ANKLE SURGERY POST-OP INSTRUCTIONS   Pain Management The numbing medicine and your leg will last around 18 hours, take a dose of your pain medicine as soon as you feel it wearing off to avoid rebound pain. Keep your foot elevated above heart level.  Make sure that your heel hangs free ('floats'). Take all prescribed medication as directed. If taking narcotic pain medication you may want to use an over-the-counter stool softener to avoid constipation. You may take over-the-counter NSAIDs (ibuprofen, naproxen, etc.) as well as over-the-counter acetaminophen as directed on the packaging as a supplement for your pain and may also use it to wean away from the prescription medication.  Activity Non-weightbearing Keep splint intact  First Postoperative Visit Your first postop visit will be at least 2 weeks after surgery.  This should be scheduled when you schedule surgery. If you do not have a postoperative visit scheduled please call [phone] to schedule an appointment. At the appointment your incision will be evaluated for suture removal, x-rays will be obtained if necessary.  General Instructions Swelling is very common after foot and ankle surgery.  It often takes 3 months for the foot and ankle to begin to feel comfortable.  Some amount of swelling will persist for 6-12 months. DO NOT change the dressing.  If there is a problem with the dressing (too tight, loose, gets wet, etc.) please contact Dr. Donnie Mesa office. DO NOT get the dressing wet.  For showers you can use an over-the-counter cast cover or wrap a washcloth around the top of your dressing and then cover it with a plastic bag and tape it to your leg. DO NOT soak the incision (no tubs, pools, bath, etc.) until you have approval from Dr. Susa Simmonds.  Contact Dr. Garret Reddish office or go to Emergency Room if: Temperature above 101 F. Increasing pain that is unresponsive to pain medication or elevation Excessive redness or  swelling in your foot Dressing problems - excessive bloody drainage, looseness or tightness, or if dressing gets wet Develop pain, swelling, warmth, or discoloration of your calf

## 2023-08-10 NOTE — Anesthesia Procedure Notes (Signed)
 Anesthesia Regional Block: Popliteal block   Pre-Anesthetic Checklist: , timeout performed,  Correct Patient, Correct Site, Correct Laterality,  Correct Procedure, Correct Position, site marked,  Risks and benefits discussed,  Pre-op evaluation,  At surgeon's request and post-op pain management  Laterality: Lower and Left  Prep: Maximum Sterile Barrier Precautions used, chloraprep       Needles:  Injection technique: Single-shot  Needle Type: Echogenic Needle     Needle Length: 9cm  Needle Gauge: 21     Additional Needles:   Procedures:,,,, ultrasound used (permanent image in chart),,    Narrative:  Start time: 08/10/2023 9:56 AM End time: 08/10/2023 10:02 AM Injection made incrementally with aspirations every 5 mL.  Performed by: Personally  Anesthesiologist: Trevor Iha, MD  Additional Notes: Block assessed. Patient tolerated procedure well.

## 2023-08-10 NOTE — Op Note (Signed)
 Paula Roberts female 23 y.o. 08/10/2023  PreOperative Diagnosis: Left zone 2 fifth metatarsal base fracture  PostOperative Diagnosis: Same  PROCEDURE: Open treatment of fifth metatarsal base, left  SURGEON: Dub Mikes, MD  ASSISTANT: Jesse [place], PA-C was necessary for patient positioning, prep, drape and assistance with fracture reduction  ANESTHESIA: MAC with peripheral nerve block  FINDINGS: Zone 2 metatarsal base fracture of the fifth metatarsal  IMPLANTS: Arthrex 5.5 mm Jones screw  INDICATIONS:23 y.o. female sustained the above injury.  There was evidence of pre-existing stress reaction in the bone.  She was indicated for surgery due to displacement of the fracture and the nature of fracture healing.   Patient understood the risks, benefits and alternatives to surgery which include but are not limited to wound healing complications, infection, nonunion, malunion, need for further surgery as well as damage to surrounding structures. They also understood the potential for continued pain in that there were no guarantees of acceptable outcome After weighing these risks the patient opted to proceed with surgery.  PROCEDURE: Patient was identified in the preoperative holding area.  The left foot was marked by myself.  Consent was signed by myself and the patient.  Block was performed by anesthesia in the preoperative holding area.  Patient was taken to the operative suite and placed supine on the operative table.  MAC anesthesia was induced without difficulty. Bump was placed under the operative hip and bone foam was used.  All bony prominences were well padded. Preoperative antibiotics were given. The extremity was prepped and draped in the usual sterile fashion and surgical timeout was performed.    We began by making an outline of the fifth metatarsal base.  Then a small incision was made proximal and dorsal to the starting point.  Incision was approximately 1.5 cm in  length.  This was taken sharply down through skin and subcutaneous tissue.  Then blunt dissection was used to gain access to the base of the fifth metatarsal.  Fluoroscopy confirmed appropriate position.  The fracture was confirmed to be appropriately reduced.  Then the guidepin was placed for the Jones screw at the tip of the fifth metatarsal base.  This was advanced within the fifth metatarsal canal and found on fluoroscopy in multiple planes to be in the appropriate position.  Then the length of the screw was measured.  Then a cannulated drill was used over the wire to enter the base of the fifth metatarsal and the bone was drilled across the fracture site.  Then using a tap the canal was tapped to allow for screw entry.  Then the screw was placed without difficulty.  This was a noncannulated screw.  It was 5.54mm in diameter.  Fluoroscopy was used to confirm appropriate screw position in multiple planes and length.  There was good screw fixation and bite.  The wound was then irrigated copiously with normal saline.  The tissue was closed in a layered fashion using 3-0 Monocryl in 3-0 nylon.  Soft dressing was placed. Splint was placed.  Patient was awake from anesthesia and taken recovery in stable condition.   POST OPERATIVE INSTRUCTIONS: Nonweightbearing to operative lower extremity.      Remove the dressing and 5 days and place a smaller Band-Aid.  Avoid getting the wound wet until follow-up. Follow-up in 2 weeks for wound check and suture removal if appropriate. Nonweightbearing x-rays of the left foot on arrival.   TOURNIQUET TIME:None  BLOOD LOSS:  Minimal  DRAINS: none         SPECIMEN: none       COMPLICATIONS:  * No complications entered in OR log *         Disposition: PACU - hemodynamically stable.         Condition: stable

## 2023-08-14 ENCOUNTER — Encounter (HOSPITAL_COMMUNITY): Payer: Self-pay | Admitting: Orthopaedic Surgery

## 2023-08-28 DIAGNOSIS — S92302A Fracture of unspecified metatarsal bone(s), left foot, initial encounter for closed fracture: Secondary | ICD-10-CM | POA: Diagnosis not present

## 2023-09-25 DIAGNOSIS — S92302A Fracture of unspecified metatarsal bone(s), left foot, initial encounter for closed fracture: Secondary | ICD-10-CM | POA: Diagnosis not present

## 2023-11-06 DIAGNOSIS — S92302D Fracture of unspecified metatarsal bone(s), left foot, subsequent encounter for fracture with routine healing: Secondary | ICD-10-CM | POA: Diagnosis not present

## 2024-01-26 ENCOUNTER — Encounter: Payer: Self-pay | Admitting: *Deleted

## 2024-07-10 ENCOUNTER — Ambulatory Visit: Admitting: Women's Health
